# Patient Record
Sex: Female | Born: 1970 | Hispanic: Yes | Marital: Married | Smoking: Never smoker
Health system: Southern US, Community
[De-identification: ages and names within clinical notes are randomized; demographics above are authoritative.]

## PROBLEM LIST (undated history)

## (undated) DIAGNOSIS — I10 Essential (primary) hypertension: Secondary | ICD-10-CM

## (undated) DIAGNOSIS — N289 Disorder of kidney and ureter, unspecified: Secondary | ICD-10-CM

## (undated) HISTORY — PX: LUNG SURGERY: SHX703

---

## 2015-01-16 ENCOUNTER — Emergency Department (HOSPITAL_COMMUNITY)
Admission: EM | Admit: 2015-01-16 | Discharge: 2015-01-16 | Disposition: A | Discharge status: Home / Self Care | Payer: Self-pay | Attending: Emergency Medicine | Admitting: Emergency Medicine

## 2015-01-16 ENCOUNTER — Emergency Department (HOSPITAL_COMMUNITY): Payer: Self-pay

## 2015-01-16 ENCOUNTER — Encounter (HOSPITAL_COMMUNITY): Payer: Self-pay | Admitting: *Deleted

## 2015-01-16 DIAGNOSIS — Z87442 Personal history of urinary calculi: Secondary | ICD-10-CM | POA: Insufficient documentation

## 2015-01-16 DIAGNOSIS — I10 Essential (primary) hypertension: Secondary | ICD-10-CM | POA: Insufficient documentation

## 2015-01-16 DIAGNOSIS — Z3202 Encounter for pregnancy test, result negative: Secondary | ICD-10-CM | POA: Insufficient documentation

## 2015-01-16 DIAGNOSIS — Z9889 Other specified postprocedural states: Secondary | ICD-10-CM | POA: Insufficient documentation

## 2015-01-16 DIAGNOSIS — N201 Calculus of ureter: Secondary | ICD-10-CM

## 2015-01-16 HISTORY — DX: Essential (primary) hypertension: I10

## 2015-01-16 HISTORY — DX: Disorder of kidney and ureter, unspecified: N28.9

## 2015-01-16 LAB — BASIC METABOLIC PANEL
Anion gap: 7 (ref 5–15)
BUN: 9 mg/dL (ref 6–23)
CO2: 26 mmol/L (ref 19–32)
Calcium: 8.7 mg/dL (ref 8.4–10.5)
Chloride: 101 mmol/L (ref 96–112)
Creatinine, Ser: 0.76 mg/dL (ref 0.50–1.10)
GFR calc Af Amer: >90 mL/min (ref >90)
GFR calc non Af Amer: >90 mL/min (ref >90)
Glucose, Bld: 157 mg/dL — ABNORMAL HIGH (ref 70–99)
POTASSIUM: 3.4 mmol/L — AB (ref 3.5–5.1)
Sodium: 134 mmol/L — ABNORMAL LOW (ref 135–145)

## 2015-01-16 LAB — URINE MICROSCOPIC-ADD ON

## 2015-01-16 LAB — URINALYSIS, ROUTINE W REFLEX MICROSCOPIC
Bilirubin Urine: NEGATIVE
GLUCOSE, UA: NEGATIVE mg/dL
Ketones, ur: NEGATIVE mg/dL
Leukocytes, UA: NEGATIVE
Nitrite: NEGATIVE
PH: 8 (ref 5.0–8.0)
PROTEIN: NEGATIVE mg/dL
Specific Gravity, Urine: 1.014 (ref 1.005–1.030)
UROBILINOGEN UA: 0.2 mg/dL (ref 0.0–1.0)

## 2015-01-16 LAB — PREGNANCY, URINE: Preg Test, Ur: NEGATIVE

## 2015-01-16 MED ORDER — ONDANSETRON HCL 4 MG/2ML IJ SOLN
4.0000 mg | Freq: Once | INTRAMUSCULAR | Status: AC
  Administered 2015-01-16: 4 mg via INTRAVENOUS
  Filled 2015-01-16: qty 2

## 2015-01-16 MED ORDER — HYDROMORPHONE HCL 1 MG/ML IJ SOLN
1.0000 mg | Freq: Once | INTRAMUSCULAR | Status: AC
  Administered 2015-01-16: 1 mg via INTRAVENOUS
  Filled 2015-01-16: qty 1

## 2015-01-16 MED ORDER — OXYCODONE-ACETAMINOPHEN 5-325 MG PO TABS: 1.0000 | ORAL_TABLET | Freq: Four times a day (QID) | ORAL | Status: DC | PRN

## 2015-01-16 MED ORDER — ONDANSETRON 4 MG PO TBDP: 4.0000 mg | ORAL_TABLET | Freq: Three times a day (TID) | ORAL | Status: DC | PRN

## 2015-01-16 NOTE — ED Notes (Signed)
Per EMS pt from home with c/o left sided abdominal pain an frequent urination since 4:00 this morning.

## 2015-01-16 NOTE — Discharge Instructions (Signed)
Cálculos renales °(Kidney Stones) °Los cálculos renales (urolitiasis) son masas sólidas que se forman en el interior de los riñones. El dolor intenso es causado por el movimiento de la piedra a través del riñón, uréter, vejiga y uretra (tracto urinario). Cuando la piedra se mueve, el uréter hace un espasmo alrededor de la misma. El cálculo generalmente se elimina con el pis (la orina).  °CUIDADOS EN EL HOGAR °· Debe ingerir gran cantidad de líquido para mantener la orina de tono claro o color amarillo pálido. Esto ayudará a eliminar la piedra. °· Cuele la orina con el colador que le han provisto. Noorine de otra forma que no sea a través del colador, ni siquiera una vez. Si elimina la piedra, se retendrá en el colador. Puede ser tan pequeña como un grano de sal. Llévela a su visita con el médico. Esto ayudará a que el médico le indique qué puede hacer para tratar de prevenir la ocurrencia de nuevos cálculos renales. °· Sólo tome los medicamentos que le haya indicado su médico. °· Concurra a las consultas de control con el médico, según las indicaciones. °· Hágase las radiografías según las indicaciones de su médico. °SOLICITE AYUDA SI: °Siente un dolor que empeora aún tomando analgésicos. °SOLICITE AYUDA DE INMEDIATO SI:  °· El dolor no mejora con los medicamentos recetados. °· Siente escalofríos o fiebre. °· El dolor aumenta y empeora en las siguientes 18 horas. °· Siente un nuevo dolor en el vientre (abdominal). °· Sufre mareos o se desmaya. °· Nota que no puede orinar. °ASEGÚRESE DE QUE:  °· Comprende estas instrucciones. °· Controlará su afección. °· Recibirá ayuda de inmediato si no mejora o si empeora. °Document Released: 12/14/2008 Document Revised: 05/20/2013 °ExitCare® Patient Information ©2015 [employer]. This information is not intended to replace advice given to you by your health care provider. Make sure you discuss any questions you have with your health care provider. ° °

## 2015-01-16 NOTE — ED Provider Notes (Signed)
CSN: [id number]     Arrival date & time 01/16/15  0720 History   First MD Initiated Contact with Patient 01/16/15 0725     Chief Complaint  Patient presents with  . Abdominal Pain     (Consider location/radiation/quality/duration/timing/severity/associated sxs/prior Treatment) HPI Comments: Pt comes in with left flank pain and abdominal pain that started yesterday. Has urinary frequency. No fever, vomiting or diarrhea. Hasn't taken anything for the symptoms. Nothing makes the symptoms better or worse:pain is similar to previous kidney stones  The history is provided by the patient. A language interpreter was used.    Past Medical History  Diagnosis Date  . Renal disorder     hx of kidney stones  . Hypertension    Past Surgical History  Procedure Laterality Date  . Lung surgery    . Cesarean section     No family history on file. History  Substance Use Topics  . Smoking status: Never Smoker   . Smokeless tobacco: Not on file  . Alcohol Use: No   OB History    No data available     Review of Systems  All other systems reviewed and are negative.     Allergies  Review of patient's allergies indicates no known allergies.  Home Medications   Prior to Admission medications   Not on File   BP 187/104 mmHg  Pulse 75  Temp(Src) 98.2 F (36.8 C) (Oral)  Resp 18  Wt 160 lb (72.576 kg)  SpO2 100%  LMP 01/02/2015 Physical Exam  Constitutional: She is oriented to person, place, and time. She appears well-developed and well-nourished.  HENT:  Head: Normocephalic and atraumatic.  Cardiovascular: Normal rate and regular rhythm.   Pulmonary/Chest: Effort normal and breath sounds normal.  Abdominal: Soft. Bowel sounds are normal. There is no CVA tenderness.  Musculoskeletal: Normal range of motion.  Neurological: She is alert and oriented to person, place, and time.  Skin: Skin is warm and dry.  Psychiatric: She has a normal mood and affect.  Nursing note and vitals  reviewed.   ED Course  Procedures (including critical care time) Labs Review Labs Reviewed  BASIC METABOLIC PANEL - Abnormal; Notable for the following:    Sodium 134 (*)    Potassium 3.4 (*)    Glucose, Bld 157 (*)    All other components within normal limits  URINALYSIS, ROUTINE W REFLEX MICROSCOPIC - Abnormal; Notable for the following:    APPearance CLOUDY (*)    Hgb urine dipstick SMALL (*)    All other components within normal limits  URINE MICROSCOPIC-ADD ON - Abnormal; Notable for the following:    Bacteria, UA FEW (*)    All other components within normal limits  PREGNANCY, URINE    Imaging Review Ct Renal Stone Study  01/16/2015   CLINICAL DATA:  44 year old female with a history of abdominal pain. Frequent urination.  EXAM: CT ABDOMEN AND PELVIS WITHOUT CONTRAST  TECHNIQUE: Multidetector CT imaging of the abdomen and pelvis was performed following the standard protocol without IV contrast.  COMPARISON:  None.  FINDINGS: Lower chest:  Unremarkable appearance of the superficial soft tissues the chest wall.  Heart size within normal limits with no pericardial fluid/ thickening.  Small hiatal hernia.  No lower mediastinal adenopathy.  Abdomen/pelvis:  Unremarkable appearance of the liver, spleen, bilateral adrenal glands.  Unremarkable appearance of pancreas. Unremarkable appearance of gallbladder. No intrahepatic or extrahepatic biliary ductal dilatation.  Bilateral nephrolithiasis.  On the right there is a  3 mm stone in the dependent collecting system with no hydronephrosis or perinephric fluid. Unremarkable appearance of the right ureter.  1 cm stone in the hilum/ talus L system of the left collecting system. There is pelvicaliectasis/mild hydronephrosis with perinephric fluid/stranding. Dilation of the length of the left ureter with a distal ureteral stone measuring 7 mm at the ureterovesical junction.  Urinary bladder unremarkable with no intraluminal stones.  No abnormally  distended small bowel or colon. No transition point identified. Normal appendix.  No free intraperitoneal air  Unremarkable appearance of the uterus and adnexa.  Vascular:  No significant atherosclerosis.  No abdominal aneurysm.  Musculoskeletal:  No displaced fracture. Mild scoliotic curvature of the thoracolumbar spine.  No significant degenerative changes with no bony canal narrowing.  IMPRESSION: Obstructive left-sided ureteral stone at the ureterovesical junction measuring 7 mm. There is associated mild hydronephrosis/ pelvicaliectasis of the left with inflammatory stranding/ fluid surrounding the left kidney. If there is concern for infection, recommend correlation with urinalysis.  Bilateral nonobstructive kidney stones, with 3 mm right-sided stone and 1 cm left-sided stone.  Signed,  Yvone NeuJaime S. Loreta AveWagner, DO  [facility]  [facility]   Electronically Signed   By: Gilmer MorJaime  Wagner D.O.   On: 01/16/2015 09:19     EKG Interpretation None      MDM   Final diagnoses:  Ureteral stone    Pt is not having fever and is more comfortable at this time. Will give one more dose of dilaudid here and have her follow up with urology. Discussed return precaution. Will send home with oxycodone and zofran. No infection in the urine at this time    Teressa LowerVrinda Shaw Dobek, NP 01/16/15 1018  Azalia BilisKevin Campos, MD 01/16/15 1036

## 2015-01-16 NOTE — ED Notes (Signed)
Pt ambulated to bathroom 

## 2015-01-16 NOTE — ED Notes (Signed)
Bed: WA06 Expected date:  Expected time:  Means of arrival:  Comments: EMS 44yo F abd pain left sided with rebound tenderness, UTI sx

## 2015-03-17 ENCOUNTER — Ambulatory Visit: Payer: Self-pay | Attending: Internal Medicine

## 2015-08-15 ENCOUNTER — Encounter (HOSPITAL_COMMUNITY): Payer: Self-pay | Admitting: Emergency Medicine

## 2015-08-15 ENCOUNTER — Emergency Department (INDEPENDENT_AMBULATORY_CARE_PROVIDER_SITE_OTHER)
Admission: EM | Admit: 2015-08-15 | Discharge: 2015-08-15 | Disposition: A | Discharge status: Home / Self Care | Payer: Self-pay | Source: Home / Self Care | Attending: Family Medicine | Admitting: Family Medicine

## 2015-08-15 DIAGNOSIS — T148 Other injury of unspecified body region: Secondary | ICD-10-CM

## 2015-08-15 DIAGNOSIS — M546 Pain in thoracic spine: Secondary | ICD-10-CM

## 2015-08-15 DIAGNOSIS — T148XXA Other injury of unspecified body region, initial encounter: Secondary | ICD-10-CM

## 2015-08-15 LAB — POCT URINALYSIS DIP (DEVICE)
BILIRUBIN URINE: NEGATIVE
GLUCOSE, UA: NEGATIVE mg/dL
KETONES UR: NEGATIVE mg/dL
LEUKOCYTES UA: NEGATIVE
Nitrite: NEGATIVE
Protein, ur: NEGATIVE mg/dL
Specific Gravity, Urine: 1.02 (ref 1.005–1.030)
UROBILINOGEN UA: 0.2 mg/dL (ref 0.0–1.0)
pH: 7 (ref 5.0–8.0)

## 2015-08-15 LAB — POCT PREGNANCY, URINE: Preg Test, Ur: NEGATIVE

## 2015-08-15 MED ORDER — TRAMADOL HCL 50 MG PO TABS
50.0000 mg | ORAL_TABLET | Freq: Four times a day (QID) | ORAL | Status: DC | PRN
Start: 2015-08-15 — End: 2015-12-07

## 2015-08-15 MED ORDER — NAPROXEN 375 MG PO TBEC: DELAYED_RELEASE_TABLET | ORAL | Status: DC

## 2015-08-15 NOTE — ED Provider Notes (Signed)
CSN: [id number]     Arrival date & time 08/15/15  1320 History   First MD Initiated Contact with Patient 08/15/15 1505     Chief Complaint  Patient presents with  . Flank Pain   (Consider location/radiation/quality/duration/timing/severity/associated sxs/prior Treatment) HPI Comments: 44 year old female with a complaint of pain to the right parathoracic musculature for at least 4 months. He is gradually getting worse over that time. She works as a Producer, television/film/video at [employer]. She states that the work exacerbates her pain and she is unable to do her job as quickly as usual and she is always the last one to finish due to her back pain. She states the area feels hot. It is worse with turning and other such movements such as lifting and pulling. Denies shortness of breath or chest pain. Denies focal paresthesias or weakness. She states she had a fall 4 months ago but is unsure as to whether this was the cause of the pain.   Past Medical History  Diagnosis Date  . Renal disorder     hx of kidney stones  . Hypertension    Past Surgical History  Procedure Laterality Date  . Lung surgery    . Cesarean section     No family history on file. Social History  Substance Use Topics  . Smoking status: Never Smoker   . Smokeless tobacco: None  . Alcohol Use: No   OB History    No data available     Review of Systems  Constitutional: Positive for activity change. Negative for fever and chills.  HENT: Negative.   Respiratory: Negative.   Cardiovascular: Negative.   Genitourinary: Negative.   Musculoskeletal: Positive for myalgias and back pain. Negative for neck pain and neck stiffness.       As per HPI  Skin: Negative for color change and rash.  Neurological: Negative.  Negative for tremors, speech difficulty, numbness and headaches.    Allergies  Review of patient's allergies indicates no known allergies.  Home Medications   Prior to Admission medications   Medication Sig  Start Date End Date Taking? Authorizing Provider  bisoprolol-hydrochlorothiazide (ZIAC) 5-6.25 MG per tablet Take 1 tablet by mouth daily.    Historical Provider, MD  Naproxen 375 MG TBEC Take 1 po q 12h prn pain. Take with food 08/15/15   Hayden Rasmussen, NP  ondansetron (ZOFRAN ODT) 4 MG disintegrating tablet Take 1 tablet (4 mg total) by mouth every 8 (eight) hours as needed for nausea or vomiting. 01/16/15   Teressa Lower, NP  oxyCODONE-acetaminophen (PERCOCET/ROXICET) 5-325 MG per tablet Take 1-2 tablets by mouth every 6 (six) hours as needed for severe pain. 01/16/15   Teressa Lower, NP  traMADol (ULTRAM) 50 MG tablet Take 1 tablet (50 mg total) by mouth every 6 (six) hours as needed for moderate pain. 08/15/15   Hayden Rasmussen, NP   Meds Ordered and Administered this Visit  Medications - No data to display  BP 153/94 mmHg  Pulse 78  Temp(Src) 98.9 F (37.2 C) (Oral)  Resp 16  SpO2 100%  LMP 07/24/2015 No data found.   Physical Exam  Constitutional: She is oriented to person, place, and time. She appears well-developed and well-nourished. No distress.  HENT:  Head: Normocephalic and atraumatic.  Eyes: EOM are normal.  Neck: Normal range of motion. Neck supple.  Cardiovascular: Normal rate.   Pulmonary/Chest: Effort normal and breath sounds normal. No respiratory distress.  Musculoskeletal: Normal range of motion. She exhibits  no edema.   There is tenderness to palpation along the right parathoracic spine musculature. Pain is exacerbated by having the patient leaning forward and rotation to the right and left. There is no spinal tenderness. No spinal deformity, swelling or discoloration.  Distal muscle strength is intact. Ambulation is coordinated and balanced. There is no chest wall  tenderness, no   tendernessto the right back or the right ribs. no tenderness left of the thoracic spine.  Neurological: She is alert and oriented to person, place, and time. No cranial nerve deficit.   Skin: Skin is warm and dry.  Nursing note and vitals reviewed.   ED Course  Procedures (including critical care time)  Labs Review Labs Reviewed  POCT URINALYSIS DIP (DEVICE) - Abnormal; Notable for the following:    Hgb urine dipstick TRACE (*)    All other components within normal limits  POCT PREGNANCY, URINE    Imaging Review No results found.   Visual Acuity Review  Right Eye Distance:   Left Eye Distance:   Bilateral Distance:    Right Eye Near:   Left Eye Near:    Bilateral Near:         MDM   1. Right-sided thoracic back pain   2. Muscle strain    The patient has been instructed that her job is exacerbating her pain. She may have to have physical therapy in the future but will have to have a referral and evaluation by a PCP. The patient has been given financial aid assistance card as well as [facility] She has an appointment next month. She is to apply heat to the area pain. She is given a prescription for Naprosyn 375 twice a day all tram 50 mg every 6 hours hours when necessary pain #15. A note was also written stating that she was here today and that she is requesting a light duty. Any additional notes for restrictions or off work on need to come from her PCP. An interpreter was used for this encounter.    Hayden Rasmussenavid Barbar Brede, NP 08/15/15 1620

## 2015-08-15 NOTE — Discharge Instructions (Signed)
Terapia con calor (Heat Therapy) La terapia con calor puede ayudar a aliviar articulaciones y msculos doloridos, lesionados, tensos y rgidos. El calor [employer], lo cual puede ayudar a Engineer, materials.  RIESGOS Y COMPLICACIONES Si tiene cualquiera de los [address], no utilice la terapia con calor a menos que su mdico lo haya autorizado:  Mala circulacin.  Heridas que se estn curando o piel con cicatrices en la zona a tratar.  Diabetes, enfermedades cardacas o hipertensin arterial.  Incapacidad de sentir (entumecimiento) la zona tratada.  Hinchazn inusual de la zona a tratar.  Infecciones activas.  Cogulos sanguneos.  Cncer.  Incapacidad de Marketing executive. Esto puede incluir nios pequeos y personas que tienen problemas con la funcin cerebral (demencia).  Embarazo. La terapia con calor solo se debe usar en lesiones viejas, preexistentes o de larga duracin (crnicas). No utilice la terapia con calor en lesiones nuevas a menos que el mdico se lo indique. CMO USAR LA TERAPIA CON CALOR Existen varios tipos distintos de terapia con calor, como:  Compresas hmedas calientes.  Bao de agua caliente.  Bolsa de agua caliente.  Almohadilla trmica.  Bolsa de gel caliente.  Vendaje caliente.  Almohadilla trmica. Utilice el mtodo de terapia con calor que le sugiera su mdico. Siga las indicaciones del mdico sobre cmo y cundo usar la terapia con Airline pilot. RECOMENDACIONES GENERALES PARA LA TERAPIA CON CALOR  No duerma mientras [place] la terapia con calor. Utilice la terapia con calor solo mientras est despierto.  La piel puede volverse rosada mientras [place] la terapia con calor. No use la terapia con calor si la piel se pone roja.  No use la terapia con calor si siente un dolor nuevo.  Una temperatura muy alta o una exposicin prolongada al calor puede causar quemaduras. Sea cauto con la terapia de calor para evitar quemar la piel.  No use la  terapia con calor en zonas de la piel que ya estn irritadas, como con una erupcin o una quemadura de sol. SOLICITE ATENCIN MDICA SI:  Observa ampollas, enrojecimiento, hinchazn o adormecimiento.  Siente un dolor nuevo.  El dolor [place]. ASEGRESE DE QUE:  Comprende estas instrucciones.  Controlar su afeccin.  Recibir ayuda de inmediato si no mejora o si empeora.   Esta informacin no tiene Theme park manager el consejo del mdico. Asegrese de hacerle al mdico cualquier pregunta que tenga.   Document Released: 12/10/2011 Document Revised: 10/08/2014 Elsevier Interactive Patient Education 2016 [employer].  Distensin torcica Producer, television/film/video) Una distensin torcica, a veces llamada distensin dorsal, es una lesin [employer] o los tendones que estn unidos a la parte superior de la espalda, por detrs del pecho. Este tipo de lesin se produce debido a la sobreexigencia o la sobrecarga de un msculo.  Las distensiones torcicas pueden ser leves o graves. Las leves se caracterizan por la distensin de un msculo o un tendn sin que se produzca su rotura. Estas lesiones pueden curarse en el trmino de 1 o 2semanas. Las distensiones ms graves se caracterizan por la rotura de las fibras musculares o de los tendones. Estas causarn ms dolor y pueden tardar de 6 a 8semanas en curarse. CAUSAS Esta afeccin puede ser causada por lo siguiente:  Una lesin en la cual se ejerce una fuerza repentina en un msculo.  La actividad fsica que se realiza sin Dance movement psychotherapist correspondiente.  El uso excesivo del msculo.  La manera incorrecta de realizar algunos movimientos.  Otras lesiones alrededor de la  zona dorsal o que producen tensin en el rea, lo que causa una D.R. Horton, Incsobrecarga en los msculos. En algunos casos, es posible que la causa no se conozca. FACTORES DE RIESGO Esta lesin es ms frecuente en las siguientes personas:  Los deportistas.  Las Eli Lilly and Companypersonas que  tienen obesidad. SNTOMAS El sntoma principal de esta afeccin es el dolor, especialmente con el movimiento. Otros sntomas pueden ser los siguientes:  Hematomas.  Hinchazn.  Espasmo. DIAGNSTICO Esta afeccin se puede diagnosticar mediante un examen fsico. Se pueden tomar radiografas para descartar una fractura. TRATAMIENTO El tratamiento de esta afeccin puede incluir lo siguiente:  Reposo y aplicacin de hielo en la zona de la lesin.  Fisioterapia. que incluir ejercicios de elongacin y fortalecimiento.  Analgsicos y antiinflamatorios. INSTRUCCIONES PARA EL CUIDADO EN EL HOGAR  Descanse todo lo que sea necesario. Siga las indicaciones del mdico respecto de las restricciones en las actividades.  Si se lo indican, aplique hielo sobre la zona lesionada:  Ponga el hielo en una bolsa plstica.  Coloque una toalla entre la piel y la bolsa de hielo.  Coloque el hielo durante 20minutos, 2 a 3veces por Futures traderda.  Tome los medicamentos de venta libre y los recetados solamente como se lo haya indicado el mdico.  Comience a Copyhacer los ejercicios como se lo hayan indicado el mdico o el fisioterapeuta.  Antes de hacer actividad fsica o de practicar deportes, siempre precaliente correctamente.  Flexione las rodillas antes de levantar objetos pesados.  Concurra a todas las visitas de control como se lo haya indicado el mdico. Esto es importante. SOLICITE ATENCIN MDICA SI:  El dolor no se alivia con los United Parcelmedicamentos.  El dolor, los hematomas o la hinchazn estn empeorando.  Tiene fiebre. SOLICITE ATENCIN MDICA DE INMEDIATO SI:  Le falta el aire.  Siente dolor en el pecho.  Siente adormecimiento o debilidad en las piernas.  Tiene prdidas involuntarias de orina (incontinencia urinaria).   Esta informacin no tiene Theme park managercomo fin reemplazar el consejo del mdico. Asegrese de hacerle al mdico cualquier pregunta que tenga.   Document Released: 12/25/2007 Document  Revised: 06/08/2015 Elsevier Interactive Patient Education [employer]2016 Elsevier Inc.

## 2015-08-15 NOTE — ED Notes (Signed)
Patient assessment obtained with assistance of language line.  Patient has right lower rib cage pain, pain that occurred after a fall 4 months ago.  Patient reports falling in front of her house and landed on ground.  Reports this lower rib pain has been getting worse since fall.  Patient works in Multimedia programmerhouse keeping.  Patient mentions concern for damaging lung.

## 2015-08-23 ENCOUNTER — Inpatient Hospital Stay: Payer: Self-pay | Admitting: Family Medicine

## 2015-12-07 ENCOUNTER — Encounter: Payer: Self-pay | Admitting: Internal Medicine

## 2015-12-07 ENCOUNTER — Ambulatory Visit: Payer: BLUE CROSS/BLUE SHIELD | Attending: Internal Medicine | Admitting: Internal Medicine

## 2015-12-07 ENCOUNTER — Telehealth: Payer: Self-pay

## 2015-12-07 VITALS — BP 144/92 | HR 63 | Temp 98.2°F | Resp 16 | Ht <= 58 in | Wt 160.0 lb

## 2015-12-07 DIAGNOSIS — I160 Hypertensive urgency: Secondary | ICD-10-CM | POA: Insufficient documentation

## 2015-12-07 DIAGNOSIS — M545 Low back pain: Secondary | ICD-10-CM | POA: Insufficient documentation

## 2015-12-07 DIAGNOSIS — I10 Essential (primary) hypertension: Secondary | ICD-10-CM | POA: Insufficient documentation

## 2015-12-07 DIAGNOSIS — N200 Calculus of kidney: Secondary | ICD-10-CM | POA: Insufficient documentation

## 2015-12-07 DIAGNOSIS — Z0000 Encounter for general adult medical examination without abnormal findings: Secondary | ICD-10-CM | POA: Insufficient documentation

## 2015-12-07 LAB — RENAL FUNCTION PANEL
Albumin: 3.8 g/dL (ref 3.6–5.1)
BUN: 8 mg/dL (ref 7–25)
CHLORIDE: 101 mmol/L (ref 98–110)
CO2: 30 mmol/L (ref 20–31)
CREATININE: 0.67 mg/dL (ref 0.50–1.10)
Calcium: 9 mg/dL (ref 8.6–10.2)
GLUCOSE: 91 mg/dL (ref 65–99)
Phosphorus: 2.8 mg/dL (ref 2.5–4.5)
Potassium: 3.5 mmol/L (ref 3.5–5.3)
Sodium: 135 mmol/L (ref 135–146)

## 2015-12-07 LAB — POCT URINE PREGNANCY: PREG TEST UR: NEGATIVE

## 2015-12-07 LAB — POCT GLYCOSYLATED HEMOGLOBIN (HGB A1C): HEMOGLOBIN A1C: 5.3

## 2015-12-07 MED ORDER — BISOPROLOL-HYDROCHLOROTHIAZIDE 5-6.25 MG PO TABS: 1.0000 | ORAL_TABLET | Freq: Every day | ORAL | Status: DC

## 2015-12-07 MED ORDER — TRAMADOL HCL 50 MG PO TABS: 50.0000 mg | ORAL_TABLET | Freq: Three times a day (TID) | ORAL | Status: DC | PRN

## 2015-12-07 MED ORDER — CYCLOBENZAPRINE HCL 5 MG PO TABS: 5.0000 mg | ORAL_TABLET | Freq: Three times a day (TID) | ORAL | Status: DC | PRN

## 2015-12-07 MED ORDER — CLONIDINE HCL 0.2 MG PO TABS
0.2000 mg | ORAL_TABLET | Freq: Once | ORAL | Status: AC
  Administered 2015-12-07: 0.2 mg via ORAL

## 2015-12-07 MED FILL — traMADol HCL 50 MG TABS: 50 | 10 days supply | Qty: 30 | Fill #0

## 2015-12-07 NOTE — Progress Notes (Addendum)
Debbie Stone, is a 45 y.o. female  ZOX:[id number]  WJX:[id number]  DOB - 1970-12-20  CC:  Chief Complaint  Patient presents with  . Back Pain  . Establish Care       HPI: Debbie Stone is a 45 y.o. female here today to establish medical care.  Hasn't had primary care for years.  She is here w/ her husband.  Spanish interpreter on phone.   Larey Seat on her back about 1 year ago and since has co of intermittant right lower back pain.   Also, dx w/ kidney stones 2010 (CT renal scan 12/2014 showed 7mm stone left side w/ bilateral small stones.  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  Says pain extends down to bilateral leg/feet, and makes it hard for her to sleep. Denies tingling and numbness.  Says she takes tylenol and advil sometimes, but doesn't help.  Did not get fu when she fell 1 year ago due to insurance issues.  Only takes her bp meds when she has a headache.  She thinks the pain is making her bp worse.  Normally, husband checks bp at home and normal.   Patient has No headache, No chest pain, No abdominal pain - No Nausea, No new weakness tingling or numbness, No Cough - SOB.  States she feels warm sometimes, but denies fever/dysuria.  No Known Allergies Past Medical History  Diagnosis Date  . Renal disorder     hx of kidney stones  . Hypertension    Current Outpatient Prescriptions on File Prior to Visit  Medication Sig Dispense Refill  . Naproxen 375 MG TBEC Take 1 po q 12h prn pain. Take with food (Patient not taking: Reported on 12/07/2015) 30 each 0  . ondansetron (ZOFRAN ODT) 4 MG disintegrating tablet Take 1 tablet (4 mg total) by mouth every 8 (eight) hours as needed for nausea or vomiting. (Patient not taking: Reported on 12/07/2015) 20 tablet 0  . oxyCODONE-acetaminophen (PERCOCET/ROXICET) 5-325 MG per tablet Take 1-2 tablets by mouth every 6 (six) hours as needed for severe pain. (Patient not  taking: Reported on 12/07/2015) 15 tablet 0   No current facility-administered medications on file prior to visit.   History reviewed. No pertinent family history. Social History   Social History  . Marital Status: Married    Spouse Name: N/A  . Number of Children: N/A  . Years of Education: N/A   Occupational History  . Not on file.   Social History Main Topics  . Smoking status: Never Smoker   . Smokeless tobacco: Not on file  . Alcohol Use: No  . Drug Use: No  . Sexual Activity: Yes   Other Topics Concern  . Not on file   Social History Narrative    Review of Systems: Constitutional: Negative for fever, chills, diaphoresis, activity change, appetite change and fatigue. HENT: Negative for ear pain, nosebleeds, congestion, facial swelling, rhinorrhea, neck pain, neck stiffness and ear discharge.  Eyes: Negative for pain, discharge, redness, itching and visual disturbance. Respiratory: Negative for cough, choking, chest tightness, shortness of breath, wheezing and stridor.  Cardiovascular: + palpitations about 1 month ago and resolved. Gastrointestinal: Negative for abdominal distention. Genitourinary: Negative for dysuria, urgency, frequency, hematuria, flank pain, decreased urine volume, difficulty urinating and dyspareunia.  Musculoskeletal: + back pain, r>L, radiating down both legs, no numbness/tingling of legs, denies urinary/stool incontinence; NO joint swelling; +arthralgia and gait problem noted b/c of  pain. Neurological: Negative for dizziness, tremors, seizures, syncope, facial asymmetry, speech difficulty, weakness, light-headedness, numbness and headaches.  Hematological: Negative for adenopathy. Does not bruise/bleed easily. Psychiatric/Behavioral: Negative for hallucinations, behavioral problems, confusion, dysphoric mood, decreased concentration and agitation.    Objective:   Filed Vitals:   12/07/15 0959  BP: 171/116  Pulse: 63  Temp: 98.2 F (36.8 C)    Resp: 16   Repeat; sbp 140s  Physical Exam: Constitutional: Patient appears well-developed and well-nourished. No distress.  aaox 3. Husband in room. Spanish interpreter via phn.   Pleasant.  HENT: Normocephalic, atraumatic, External right and left ear normal. Oropharynx is clear and moist.  Eyes: Conjunctivae and EOM are normal. PERRL, no scleral icterus. Neck: Normal ROM. Neck supple. No JVD. No tracheal deviation. No thyromegaly. CVS: RRR, S1/S2 +, no murmurs, no gallops, no carotid bruit.   No le edema Pulmonary: Effort and breath sounds normal, no stridor, rhonchi, wheezes, rales.  Abdominal: Soft. BS +, no distension, tenderness, rebound or guarding.  No cva tenderness bilaterally.  Musculoskeletal: Normal range of motion. No edema.  Left anterior hip pain on flexion of left knee.  Bilateral back pain reproducible on flexion of knees.   Lymphadenopathy: No lymphadenopathy cervical. Neuro: Alert. Normal reflexes 2+ bilat le, muscle tone coordination. Skin: Skin is warm and dry. No rash noted. Not diaphoretic. No erythema. No pallor. Psychiatric: Normal mood and affect. Behavior, judgment, thought content normal.  No results found for: WBC, HGB, HCT, MCV, PLT Lab Results  Component Value Date   CREATININE 0.76 01/16/2015   BUN 9 01/16/2015   NA 134* 01/16/2015   K 3.4* 01/16/2015   CL 101 01/16/2015   CO2 26 01/16/2015    Lab Results  Component Value Date   HGBA1C 5.3 12/07/2015   Lipid Panel  No results found for: CHOL, TRIG, HDL, CHOLHDL, VLDL, LDLCALC     Assessment and plan:   1. Healthcare maintenance - POCT A1C  5.3 - due for pap, mammogram 2015 nml per pt.  2. Essential hypertension - takes home bp prn when has headache - cloNIDine (CATAPRES) tablet 0.2 mg; Take 1 tablet (0.2 mg total) by mouth once.  - given in room today  3. Hypertensive urgency - noted on arrival, may be due to pain.    Pt states when not in pain, bp chk at home normal. - clonidine  given, recd to take home rx daily for now until pain better.  Than can reeval need. - bisoprolol-hydrochlorothiazide (ZIAC) 5-6.25 MG tablet; Take 1 tablet by mouth daily.  Dispense: 90 tablet; Refill: 3 - repeat sbp 140s on rechk.  4. Bilateral low back pain, with sciatica presence unspecified - will r/ herniated disc/renal stone as cause today. - CT Lumbar Spine W Contrast; Future - cyclobenzaprine (FLEXERIL) 5 MG tablet; Take 1 tablet (5 mg total) by mouth 3 (three) times daily as needed for muscle spasms.  Dispense: 30 tablet; Refill: 0 - traMADol (ULTRAM) 50 MG tablet; Take 1 tablet (50 mg total) by mouth every 8 (eight) hours as needed.  Dispense: 30 tablet; Refill: 0  5. Kidney stones, ho - UA/M w/rflx Culture, Routine - CT RENAL STONE STUDY today - Renal Function Panel today - POCT urine pregnancy   Return in about 4 weeks (around 01/04/2016).    The patient was given clear instructions to go to ER or return to medical center if symptoms don't improve, worsen or new problems develop. The patient verbalized understanding. The patient was  told to call to get lab results if they haven't heard anything in the next week.       Pete Glatterawn T Lamerle Jabs, MD, MBA/MHA [facility] [place], [place] [phone]   12/07/2015, 10:44 AM    12/13/15 at 913am; ADDENDUM/  Insurance denied the CT lumbar spine, but did approve the CT renal protocol.   Dc'd CT lumbar order, and ordered lumbar xrays.  Pt to be notified.

## 2015-12-07 NOTE — Addendum Note (Signed)
Addended byDierdre Searles: LANGELAND, DAWN T on: 12/07/2015 12:50 PM   Modules accepted: Orders

## 2015-12-07 NOTE — Addendum Note (Signed)
Addended by: Margaretmary LombardLISBON, NUBIA K on: 12/07/2015 12:57 PM   Modules accepted: Orders

## 2015-12-07 NOTE — Patient Instructions (Signed)
-   Pick up meds at pharmacy - labs here [facility]   Dolor de espalda en adultos (Back Pain, Adult) El dolor de espalda es muy frecuente. A menudo mejora con el tiempo. La causa del dolor de espalda generalmente no es peligrosa. La Harley-Davidsonmayora de las personas puede aprender a Runner, broadcasting/film/videomanejar el dolor de espalda por s mismas.  CUIDADOS EN EL HOGAR  Controle su dolor de espalda a fin de Public house managerdetectar algn cambio. Las siguientes indicaciones ayudarn a Psychologist, clinicalaliviar cualquier dolor que pueda sentir:  Materials engineerMantngase activo. Comience con caminatas cortas sobre superficies planas si es posible. Trate de caminar un poco ms cada da.  Haga ejercicios con regularidad tal como le indic el mdico. El ejercicio ayuda a que su espalda se cure ms rpidamente. Tambin ayuda a prevenir futuras lesiones al Kimberly-Clarkmantener los msculos fuertes y flexibles.  No se siente, conduzca ni permanezca de pie durante ms de 30 minutos.  No permanezca en la cama. Si hace reposo ms de 1 a 2 das, puede demorar su recuperacin.  Sea cuidadoso al inclinarse o levantar un objeto. Use una tcnica apropiada para levantar peso:  Flexione las rodillas.  Mantenga el objeto cerca del cuerpo.  No gire.  Duerma sobre un NVR Inccolchn firme. Recustese sobre un costado y flexione las rodillas. Si se recuesta Fisher Scientificsobre la espalda, coloque una almohada debajo de las rodillas.  Tome los medicamentos solamente como se lo haya indicado el mdico.  Aplique hielo sobre la zona lesionada.  Ponga el hielo en una bolsa plstica.  Coloque una toalla entre la piel y la bolsa de hielo.  Deje el hielo durante 20minutos, 2 a 3veces por da, durante los primeros 2 o 3das. Despus de eso, puede alternar entre compresas de hielo y Airline pilotcalor.  Evite sentir ansiedad o estrs. Encuentre maneras efectivas de lidiar con el estrs, Surveyor, miningcomo hacer ejercicio.  Mantenga un peso saludable. El peso excesivo ejerce tensin sobre la espalda. SOLICITE AYUDA SI:   Siente  dolor que no se alivia con reposo o medicamentos.  Siente cada vez ms dolor que se extiende a las piernas o los glteos.  El dolor no mejora en una semana.  Siente dolor por la noche.  Pierde peso.  Siente escalofros o fiebre. SOLICITE AYUDA DE INMEDIATO SI:   No puede controlar su materia fecal (heces) o el pis (orina).  Siente debilidad en las piernas o los brazos.  Siente prdida de la sensibilidad (adormecimiento) en las piernas o los brazos.  Tiene malestar estomacal (nuseas) o vomita.  Siente dolor de estmago (abdominal).  Siente que se desvanece (se desmaya).   Esta informacin no tiene Theme park managercomo fin reemplazar el consejo del mdico. Asegrese de hacerle al mdico cualquier pregunta que tenga.   Document Released: 04/02/2011 Document Revised: 10/08/2014 Elsevier Interactive Patient Education [employer]2016 Elsevier Inc.

## 2015-12-07 NOTE — Telephone Encounter (Signed)
CMA called [employer] interpreter spoke with Britta MccreedyBarbara [phone]#[id number]. Interpreter verified patient name and DOB. Patient was given instruction for CT scan appt [facility] on 12/13/2013. Patient was told to arrive at 1:45 pm. Patient appt is scheduled at 2pm. [facility] was [facility] and to stop by the registration desk, and they would direct her to the Radiology Dept. Patient verbalized she understood, with no further questions.

## 2015-12-07 NOTE — Progress Notes (Signed)
Patient's here for lower back pain, described as throbbing. Rates pain at 9/10.  Patient c/o of R side pain located in the rib area, described has severe, rated 10/10. She thinks it maybe due to Kidney problems she's had in the past.  Patient BP was 171/116. She states she doesn't take her BP meds on regular. She reports not feeling dizzy, but exhausted.  Patient declines flu shot. Patient agreed to diabetes screening.  Patient's here to est care with PCP.

## 2015-12-09 ENCOUNTER — Telehealth: Payer: Self-pay

## 2015-12-09 LAB — MICROSCOPIC EXAMINATION: Casts: NONE SEEN /lpf

## 2015-12-09 LAB — UA/M W/RFLX CULTURE, ROUTINE
Bilirubin, UA: NEGATIVE
GLUCOSE, UA: NEGATIVE
KETONES UA: NEGATIVE
Leukocytes, UA: NEGATIVE
Nitrite, UA: NEGATIVE
PROTEIN UA: NEGATIVE
RBC, UA: NEGATIVE
SPEC GRAV UA: 1.013 (ref 1.005–1.030)
UUROB: 0.2 mg/dL (ref 0.2–1.0)
pH, UA: 6.5 (ref 5.0–7.5)

## 2015-12-09 NOTE — Telephone Encounter (Signed)
CMA called [employer] and spoke to Robertsarmen #[id number]. Interpreter verified name and DOB. Patient was given lab results and verbalized she understood. Patient is aware of her appt scheduled for the 15th of March for the double study.

## 2015-12-09 NOTE — Telephone Encounter (Signed)
-----   Message from [name], MD sent at 12/08/2015 11:56 AM EST ----- Please call patient and tell her her kidney function labs normal. Ask her when CTs are scheduled. thx

## 2015-12-13 NOTE — Addendum Note (Signed)
Addended byDierdre Searles: Carlisia Geno T on: 12/13/2015 09:14 AM   Modules accepted: Orders

## 2015-12-14 ENCOUNTER — Ambulatory Visit (HOSPITAL_COMMUNITY): Payer: BLUE CROSS/BLUE SHIELD

## 2015-12-14 ENCOUNTER — Ambulatory Visit (HOSPITAL_COMMUNITY)
Admission: RE | Admit: 2015-12-14 | Discharge: 2015-12-14 | Disposition: A | Discharge status: Home / Self Care | Payer: BLUE CROSS/BLUE SHIELD | Source: Ambulatory Visit | Attending: Internal Medicine | Admitting: Internal Medicine

## 2015-12-14 DIAGNOSIS — R935 Abnormal findings on diagnostic imaging of other abdominal regions, including retroperitoneum: Secondary | ICD-10-CM | POA: Diagnosis not present

## 2015-12-14 DIAGNOSIS — N200 Calculus of kidney: Secondary | ICD-10-CM | POA: Insufficient documentation

## 2015-12-15 ENCOUNTER — Telehealth: Payer: Self-pay

## 2015-12-15 NOTE — Telephone Encounter (Signed)
-----   Message from [name], MD sent at 12/15/2015 11:27 AM EDT ----- Please call patient and tell her that her CT abdomen showed small left kidney stone, but it is not obstructing anything and does not need any intervention at this time.  There was a small cyst about 3.6cm noted in her urterus.   We will need to do an Ultrasound of it in 6-12 wks to make sure it goes away.  Will follow up with her back films.  Consider physical therapy if all studies negative and still having back pain.

## 2015-12-15 NOTE — Telephone Encounter (Signed)
CMA called [employer] interpreter and spoke to Norva Pavlovdgar #[id number]. Patient didn't answer either of her numbers on file. [phone], and [phone]. Interpreter left a message for the patient on both lines to return my call asap.

## 2015-12-16 NOTE — Telephone Encounter (Signed)
CMA called [employer] interpreter and spoke with [place] #[id number]. Patient was not available to take my call. Patient husband answered the phone, so a message was left stating I would give her a call back. Patient husband verbalized he understood with no further questions.

## 2015-12-20 NOTE — Telephone Encounter (Signed)
CMA called [employer] interpreter and spoke to [place] CreekGloria, #[id number]. Interpreter called patient, patient didn't answer. A message was left for the patient to return my call. This is the 3rd attempt to reach patient. Patient will be sent a letter to address on file.

## 2015-12-21 ENCOUNTER — Telehealth: Payer: Self-pay

## 2015-12-21 NOTE — Telephone Encounter (Signed)
Opened in error

## 2015-12-26 ENCOUNTER — Telehealth: Payer: Self-pay | Admitting: Internal Medicine

## 2015-12-26 NOTE — Telephone Encounter (Signed)
Pt. Returned call. Pt. Is requesting her CT scan results. Please f/u

## 2015-12-27 NOTE — Telephone Encounter (Signed)
CMA called [employer] interpreter and spoke with Cris [phone]. Patient verified name and DOB. Patient was given lab results verbalized she understood with no further questions.

## 2016-03-19 ENCOUNTER — Ambulatory Visit (HOSPITAL_COMMUNITY)
Admission: RE | Admit: 2016-03-19 | Discharge: 2016-03-19 | Disposition: A | Discharge status: Home / Self Care | Payer: BLUE CROSS/BLUE SHIELD | Source: Ambulatory Visit | Attending: Internal Medicine | Admitting: Internal Medicine

## 2016-03-19 ENCOUNTER — Ambulatory Visit (HOSPITAL_BASED_OUTPATIENT_CLINIC_OR_DEPARTMENT_OTHER): Payer: BLUE CROSS/BLUE SHIELD | Admitting: Internal Medicine

## 2016-03-19 ENCOUNTER — Encounter: Payer: Self-pay | Admitting: Internal Medicine

## 2016-03-19 VITALS — BP 166/111 | HR 65 | Temp 98.7°F | Resp 18 | Ht 63.5 in | Wt 177.6 lb

## 2016-03-19 DIAGNOSIS — M545 Low back pain: Secondary | ICD-10-CM

## 2016-03-19 DIAGNOSIS — N949 Unspecified condition associated with female genital organs and menstrual cycle: Secondary | ICD-10-CM | POA: Insufficient documentation

## 2016-03-19 DIAGNOSIS — N200 Calculus of kidney: Secondary | ICD-10-CM | POA: Diagnosis not present

## 2016-03-19 DIAGNOSIS — I160 Hypertensive urgency: Secondary | ICD-10-CM | POA: Diagnosis not present

## 2016-03-19 MED ORDER — TRAMADOL HCL 50 MG PO TABS: 50.0000 mg | ORAL_TABLET | Freq: Three times a day (TID) | ORAL | Status: DC | PRN

## 2016-03-19 MED ORDER — BISOPROLOL-HYDROCHLOROTHIAZIDE 5-6.25 MG PO TABS: 1.0000 | ORAL_TABLET | Freq: Every day | ORAL | Status: DC

## 2016-03-19 MED ORDER — DICLOFENAC SODIUM 1 % TD GEL: 2.0000 g | Freq: Four times a day (QID) | TRANSDERMAL | Status: DC

## 2016-03-19 MED ORDER — CYCLOBENZAPRINE HCL 5 MG PO TABS: 5.0000 mg | ORAL_TABLET | Freq: Three times a day (TID) | ORAL | Status: DC | PRN

## 2016-03-19 MED FILL — CYCLOBENZAPRINE 5 MG TABLET: 5 | 10 days supply | Qty: 30 | Fill #0

## 2016-03-19 MED FILL — traMADol HCL 50 MG TABS: 50 | 15 days supply | Qty: 45 | Fill #0

## 2016-03-19 MED FILL — BISOPROLOL-HCTZ 5-6.25 MG T: 5-6.25 | 30 days supply | Qty: 30 | Fill #0

## 2016-03-19 NOTE — Progress Notes (Signed)
Debbie Stone, is a 45 y.o. female  ZOX:[id number]  [id number]:[id number]  DOB - 06-09-1971  Chief Complaint  Patient presents with  . Follow-up    4 week        Subjective:   Debbie Stone is a 45 y.o. female here today for a follow up visit for htn and back pain.  She never got the back lumbar films, did not know they were ordered.  Also, ran out of her bp meds few days ago as well.  No c/o today,  visual problems, headache. 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  Denies depression, just has pain issues.  Husband here w/ her.  Patient has No headache, No chest pain, No abdominal pain - No Nausea, No new weakness tingling or numbness, No Cough - SOB.  Interpreter was used to communicate directly with patient for the entire encounter including providing detailed patient instructions.  Interpreter also assisted w/ translating the pain contract as well.  Problem  Adnexal Cyst    ALLERGIES: No Known Allergies  PAST MEDICAL HISTORY: Past Medical History  Diagnosis Date  . Renal disorder     hx of kidney stones  . Hypertension     MEDICATIONS AT HOME: Prior to Admission medications   Medication Sig Start Date End Date Taking? Authorizing Provider  bisoprolol-hydrochlorothiazide (ZIAC) 5-6.25 MG tablet Take 1 tablet by mouth daily. 03/19/16   Pete Glatter, MD  cyclobenzaprine (FLEXERIL) 5 MG tablet Take 1 tablet (5 mg total) by mouth 3 (three) times daily as needed for muscle spasms. 03/19/16   Pete Glatter, MD  diclofenac sodium (VOLTAREN) 1 % GEL Apply 2 g topically 4 (four) times daily. 03/19/16   Pete Glatter, MD  Naproxen 375 MG TBEC Take 1 po q 12h prn pain. Take with food Patient not taking: Reported on 12/07/2015 08/15/15   Hayden Rasmussen, NP  ondansetron (ZOFRAN ODT) 4 MG disintegrating tablet Take 1 tablet (4 mg total) by mouth every 8 (eight) hours as needed for nausea  or vomiting. Patient not taking: Reported on 12/07/2015 01/16/15   Teressa Lower, NP  oxyCODONE-acetaminophen (PERCOCET/ROXICET) 5-325 MG per tablet Take 1-2 tablets by mouth every 6 (six) hours as needed for severe pain. Patient not taking: Reported on 12/07/2015 01/16/15   Teressa Lower, NP  traMADol (ULTRAM) 50 MG tablet Take 1 tablet (50 mg total) by mouth every 8 (eight) hours as needed. 03/19/16   Pete Glatter, MD     Objective:   Filed Vitals:   03/19/16 1141  BP: 166/111  Pulse: 65  Temp: 98.7 F (37.1 C)  TempSrc: Oral  Resp: 18  Height: 5' 3.5" (1.613 m)  Weight: 177 lb 9.6 oz (80.559 kg)  SpO2: 100%    Exam General appearance : Awake, alert, not in any distress. Speech Clear. Not toxic looking, obese HEENT: Atraumatic and Normocephalic, pupils equally reactive to light. Neck: supple, no JVD. Chest:Good air entry bilaterally, no added sounds. CVS: S1 S2 regular, no murmurs/gallups or rubs. Abdomen: Bowel sounds active, Non tender and not distended with no gaurding, rigidity or rebound. Back: ttp l4-5 spinal renal, nonradiating today. Extremities: B/L Lower Ext shows no edema, both legs are warm to touch Neurology: Awake alert, and oriented X 3, CN II-XII grossly intact, Non focal Skin:No Rash  Data Review Lab Results  Component Value Date   HGBA1C 5.3 12/07/2015  Depression screen PHQ 2/9 12/07/2015  Decreased Interest 0  Down, Depressed, Hopeless 0  PHQ - 2 Score 0    3/17 renal ct IMPRESSION: Left nephrolithiasis. There is a single dilated lower pole anterior calyx.  No evidence of right urinary obstruction.  3.6 cm cystic lesion in the right adnexa. Followup ultrasound in 6-12 weeks is recommended to ensure resolution.   Assessment & Plan   1. Hypertensive urgency, suspect worse w/ pain and ran out of meds few days ago - hr borderline so did not do clonidine. - bisoprolol-hydrochlorothiazide (ZIAC) 5-6.25 MG tablet; Take 1 tablet by  mouth daily.  Dispense: 90 tablet; Refill: 3 - rechk bp in 2wks - low salt diet reinforced,  2. Bilateral low back pain, with sciatica presence unspecified - DG Lumbar Spine Complete; Future  - reorderd - traMADol (ULTRAM) 50 MG tablet; Take 1 tablet (50 mg total) by mouth every 8 (eight) hours as needed.  Dispense: 45 tablet; Refill: 0 - cyclobenzaprine (FLEXERIL) 5 MG tablet; Take 1 tablet (5 mg total) by mouth 3 (three) times daily as needed for muscle spasms.  Dispense: 30 tablet; Refill: 0 - volterin gel trial as well - went over pain contract w/ patient and husband, with assistance of Interpreter, they are agreeable to sign.  Of note, if needs to see pain specialist/ortho based on xrays, this contract will be null and void if needs stronger pain rx. - info on back exercises provided as well.  3. Adnexal cyst, right, 3.6cm noted prior on CT - US Pelvis Complete; Future - ob transvaginal us as well,    4. Health maintenance - papsmear in 2 wks  Patient have been counseled extensively about nutrition and exercise  Return in about 2 weeks (around 04/02/2016) for htn /pap.  The patient was given clear instructions to go to ER or return to medical center if symptoms don't improve, worsen or new problems develop. The patient verbalized understanding. The patient was told to call to get lab results if they haven't heard anything in the next week.    Pete Glatterawn T Isrrael Fluckiger, MD, MBA/MHA [facility] [place], [place] [phone]   03/19/2016, 12:24 PM

## 2016-03-19 NOTE — Patient Instructions (Signed)
Plan de alimentacin con bajo contenido de sodio (Low-Sodium Eating Plan) El sodio aumenta la presin arterial y hace que el cuerpo retenga lquidos. El consumo de alimentos con menos sodio ayuda a Conservator, museum/gallery presin arterial, a Building services engineer y a Physicist, medical, el hgado y los riones. Agregar sal (cloruro de sodio) a los alimentos aumenta el aporte de [facility]. La mayor parte del sodio proviene de los alimentos enlatados, envasados y congelados. La pizza, la comida rpida y la comida de los restaurantes tambin contienen mucho sodio. Aunque usted tome medicamentos para bajar la presin arterial o reducir el lquido del cuerpo, es importante que disminuya el aporte de sodio de los alimentos. EN QU CONSISTE EL PLAN? La [employer]ley[employer]son de las personas deberan limitar la ingesta de sodio a 2300mg  por Futures trader. El mdico le recomienda que limite su consumo de sodio a 2000 mg por Futures trader.  QU DEBO SABER ACERCA DE ESTE PLAN DE ALIMENTACIN? Para el plan de alimentacin con bajo contenido de sodio, debe seguir estas pautas generales: 1. Elija alimentos con un valor porcentual diario de sodio de menos del 5% (segn se indica en la etiqueta). 2. Use hierbas o aderezos sin sal, en lugar de sal de mesa o sal marina. 3. Consulte al mdico o farmacutico antes de usar sustitutos de la sal. 4. Coma alimentos frescos. 5. Coma ms frutas y verduras. 6. Limite las verduras enlatadas. Si las consume, enjuguelas bien para disminuir el sodio. 7. Limite el consumo de queso a 1onza (28g) por da. 8. Coma productos con bajo contenido de sodio, cuya etiqueta suele decir "bajo contenido de sodio" o "sin agregado de sal". 9. Evite alimentos que contengan glutamato monosdico (MSG), que a veces se agrega a la comida [place] y a algunos alimentos enlatados. 10. Consulte las etiquetas de los alimentos (etiquetas de informacin nutricional) para saber cunto sodio contiene una porcin. 11. Consuma ms comida casera y menos de  restaurante, de buf y comida rpida. 12. Cuando coma en un restaurante, pida que preparen su comida con menos sal o, en lo posible, sin nada de sal. CMO LEO LA INFORMACIN SOBRE EL SODIO EN LAS ETIQUETAS DE LOS ALIMENTOS? La etiqueta de informacin nutricional indica la cantidad de sodio en una porcin de alimento. Si come ms de una porcin, debe multiplicar la cantidad indicada de sodio por la cantidad de porciones. Las etiquetas de los alimentos tambin pueden indicar lo siguiente: 1. Sin sodio: menos de 5mg  por porcin. 2. Cantidad muy baja de sodio: 35mg  o menos por porcin. 3. Cantidad baja de sodio: 140mg  o menos por porcin. 4. Menor cantidad de sodio: 50% menos de sodio en una porcin. Por ejemplo, si un alimento generalmente contiene 300 mg de sodio se modifica para ser [employer], tendr 150 mg de sodio. 5. Sodio reducido: 25% menos de [employer]. Por ejemplo, si un alimento que por lo general contiene 400mg  de sodio se modifica para convertirse en un alimento de sodio reducido, tendr 300mg  de sodio. QU ALIMENTOS PUEDO COMER? Cereales Cereales con bajo contenido de sodio, como [place], arroz y trigo [place], y trigo triturado. Galletas con bajo contenido de [place]. Arroz y pastas sin sal. Pan con bajo contenido de [place].  Verduras Verduras frescas o congeladas. Verduras enlatadas con bajo contenido de sodio o reducido de sodio. Pasta y salsa de tomate con contenido bajo o reducido de sodio. Jugos de tomate y verduras con contenido bajo o reducido de sodio.  Nils Pyle Frutas frescas,  congeladas y enlatadas. Jugo de frutas.  Carnes y otros productos con protenas Atn y salmn enlatado con bajo contenido de [place]odio. Carne de vaca o ave, pescado y frutos de mar frescos o congelados. Cordero. Frutos secos sin sal. Lentejas, frijoles y guisantes secos, sin sal agregada. Frijoles enlatados sin sal. Sopas caseras sin sal. Huevos.  Lcteos Leche. Leche de soja.  Queso ricota. Quesos con contenido bajo o reducido de sodio. Yogur.  Condimentos Hierbas y especias frescas y secas. Aderezos sin sal. [place]ebolla y ajo en polvo. Variedades de mostaza y ketchup con bajo contenido de [place]. Rbano picante fresco o refrigerado. Jugo de limn.  Grasas y aceites Aderezos para ensalada con contenido reducido de [place]ige[place]odio. Mantequilla sin sal.  Otros Palomitas de maz y pretzels sin sal.  Los artculos mencionados arriba pueden no ser Raytheonuna lista completa de las bebidas o los alimentos recomendados. Comunquese con el nutricionista para conocer ms opciones. QU ALIMENTOS NO SE RECOMIENDAN? Cereales  Cereales instantneos para comer caliente. Mezclas para bizcochos, panqueques y rellenos de pan. Crutones. Mezclas para pastas o arroz con condimento. Envases comerciales de sopa de fideos. Macarrones con queso envasados o congelados. Harina leudante. Galletas saladas comunes. Verduras Verduras enlatadas comunes. Pasta y salsa de tomate en lata comunes. Jugos comunes de tomate y de verduras. Verduras Hydrologistcongeladas en salsa. Papas fritas saladas. Aceitunas. Pepinillos. Salsas. Chucrut. Salsa. Carnes y otros productos con protenas Carne de vaca, pescado o frutos de mar que est salada, [place], [place]ada, condimentada con especias o con pickles. Panceta, jamn, salchichas, perros calientes, carne curada, carne picada (carne envasada de buey) y embutidos. Cerdo salado. Cecina o charqui. Arenque en escabeche. Anchoas, atn enlatado comn y sardinas. Frutos secos con sal. Celine MansLcteos Quesos para untar y quesos procesados. Requesn. Queso azul y cottage. Suero de [place].  Condimentos Sal de cebolla y ajo, sal condimentada, sal de mesa y sal marina. Salsas en lata y envasadas. Salsa [place]. Salsa trtara. Salsa barbacoa. Salsa teriyaki. Salsa de soja, incluso la que tiene contenido reducido de [place] Lakesodio. Salsa de carne. Salsa de pescado. Salsa de [place] High[place]ostras. Salsa rosada. Rbano picante envasado.  Ketchup y mostaza comunes. Saborizantes y tiernizantes para carne. Caldo en cubitos. Salsa picante. Salsa tabasco. Adobos. Aderezos para tacos. Salsas. Grasas y aceites Aderezos comunes para ensalada. Mantequilla con sal. Margarina. Mantequilla clarificada. Grasa de panceta.  Otros Nachos y papas fritas envasadas. Maz inflado y frituras de maz. Palomitas de maz y pretzels con sal. Sopas enlatadas o en polvo. Pizza. Pasteles y entradas congeladas.  Los artculos mencionados arriba pueden no ser Raytheonuna lista completa de las bebidas y los alimentos que se Theatre stage managerdeben evitar. Comunquese con el nutricionista para obtener ms informacin.   Esta informacin no tiene Theme park managercomo fin reemplazar el consejo del mdico. Asegrese de hacerle al mdico cualquier pregunta que tenga.   Document Released: 09/17/2005 Document Revised: 10/08/2014 Elsevier Interactive Patient Education 2016 [employer]   - Ejercicios para la espalda (Back Exercises) Si tiene dolor de espalda, haga estos ejercicios 2 o 3veces por da, o como se lo haya indicado el mdico. Cuando el dolor desaparezca, hgalos una vez por da, pero haga ms repeticiones de cada ejercicio. Si no le duele la espalda, haga estos ejercicios una vez por da o como se lo haya indicado el mdico. EJERCICIOS Rodilla al pecho Repita estos pasos 3 o 5veces seguidas con cada pierna: 13. Acustese boca arriba sobre una cama dura o sobre el suelo con las piernas extendidas. 14. Lleve una rodilla al pecho. 15. Mantenga  la rodilla contra el pecho. Para lograrlo tmese la rodilla o el muslo. 16. Tire de la rodilla hasta sentir una elongacin suave en la parte baja de la espalda. 17. Mantenga la elongacin durante 10 a 30segundos. 18. Suelte y extienda la pierna lentamente. Inclinacin de la pelvis Repita estos pasos 5 o 10veces seguidas: 6. Acustese boca arriba sobre una cama dura o sobre el suelo con las piernas extendidas. 7. Flexione las rodillas de manera  que apunten al techo. Los pies deben estar apoyados en el suelo. 8. Contraiga los msculos de la parte baja del vientre (abdomen) para empujar la zona lumbar contra el suelo. Este movimiento har que el cccix apunte hacia el techo, en lugar de apuntar hacia abajo en direccin a los pies o al suelo. 9. Mantenga esta posicin durante 5 a 10segundos mientras contrae suavemente los msculos y respira con normalidad. El perro y el gato Repita estos pasos hasta que la zona lumbar se curve con ms facilidad: 1. Apoye las palmas de las manos y las rodillas sobre una superficie firme. Las manos deben estar alineadas con los hombros y las rodillas con las caderas. Puede colocarse almohadillas debajo de las rodillas. 2. Deje caer la cabeza y lleve el cccix hacia abajo de modo que apunte en direccin al suelo para que la zona lumbar se arquee como el lomo de un gato [place]. 3. Mantenga esta posicin durante 5segundos. 4. Lentamente, levante la cabeza y lleve el cccix hacia arriba de modo que apunte en direccin al techo para que la espalda se arquee (hunda) como el lomo de un perro contento. 5. Mantenga esta posicin durante 5segundos. Flexiones de brazos Repita estos pasos 5 o 10veces seguidas: 1. Acustese boca abajo en el suelo. 2. Ponga las manos cerca de la cabeza, separadas aproximadamente al ancho de los hombros. 3. Con la espalda relajada y las caderas apoyadas en el suelo, extienda lentamente los brazos para levantar la mitad superior del cuerpo y Optometrist los hombros. No use los msculos de la espalda. Para estar ms cmodo, puede cambiar la [employer]. 4. Mantenga esta posicin durante 5segundos. 5. Lentamente vuelva a la posicin horizontal. Puentes Repita estos pasos 10veces seguidas: 1. Acustese boca arriba sobre una superficie firme. 2. Flexione las rodillas de manera que apunten al techo. Los pies deben estar apoyados en el suelo. 3. Contraiga los glteos y despegue las  nalgas del suelo hasta que la cintura est casi a la altura de las rodillas. Si no siente el trabajo muscular en las nalgas y la parte posterior de los muslos, aleje los pies 1 o 2pulgadas (2,5 o 5centmetros) de las nalgas. 4. Mantenga esta posicin durante 3 a 5segundos. 5. Lentamente, vuelva a apoyar las nalgas en el suelo y relaje los glteos. Si este ejercicio le resulta muy fcil, intente realizarlo con los brazos cruzados [place]. Abdominales Repita estos pasos 5 o 10veces seguidas: 1. Acustese boca arriba sobre una cama dura o sobre el suelo con las piernas extendidas. 2. Flexione las rodillas de manera que apunten al techo. Los pies deben estar apoyados en el suelo. 3. Cruce los [employer]. 4. Baje levemente el mentn en direccin al pecho, pero no doble el cuello. 5. Contraiga los msculos del abdomen y con lentitud eleve el pecho lo suficiente como para despegar levemente los omplatos del suelo. 6. Lentamente baje el pecho y la cabeza hasta el suelo. Elevaciones de espalda Repita estos pasos 5 o 10veces seguidas: 1.  Acustese boca abajo con los brazos a los costados y apoye la frente en el suelo. 2. Contraiga los msculos de las piernas y los glteos. 3. Lentamente despegue el pecho del suelo mientras mantiene las caderas apoyadas en el suelo. Mantenga la nuca alineada con la curvatura de la espalda. Mire hacia el suelo mientras hace este ejercicio. 4. Mantenga esta posicin durante 3 a 5segundos. 5. Lentamente baje el pecho y el rostro hasta el suelo. SOLICITE AYUDA SI:  El dolor de espalda se vuelve mucho ms intenso cuando hace un ejercicio.  El dolor de espalda no se [place] 2horas despus de [employer] ejercicios. Si tiene alguno de [employer] Brands, deje de [employer] ejercicios. No vuelva a hacer los ejercicios a menos que el mdico lo autorice. SOLICITE AYUDA DE INMEDIATO SI:  Siente un dolor sbito y muy intenso en la espalda. Si esto ocurre, deje  de [employer]. No vuelva a hacer los ejercicios a menos que el mdico lo autorice.   Esta informacin no tiene Theme park manager el consejo del mdico. Asegrese de hacerle al mdico cualquier pregunta que tenga.   Document Released: 01/02/2011 Document Revised: 06/08/2015 Elsevier Interactive Patient Education [employer]

## 2016-03-19 NOTE — Progress Notes (Signed)
Patient is here for 4 week FU  Patient has not taken medication today and patient has not eaten.  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

## 2016-03-19 NOTE — Addendum Note (Signed)
Addended byDierdre Searles: Debbie Stone on: 03/19/2016 01:18 PM   Modules accepted: Orders

## 2016-03-26 ENCOUNTER — Telehealth: Payer: Self-pay

## 2016-03-26 ENCOUNTER — Other Ambulatory Visit: Payer: Self-pay | Admitting: Internal Medicine

## 2016-03-26 MED ORDER — CAPSAICIN-MENTHOL-METHYL SAL 0.025-1-12 % EX CREA: 1.0000 "application " | TOPICAL_CREAM | Freq: Four times a day (QID) | CUTANEOUS | Status: DC | PRN

## 2016-03-26 NOTE — Telephone Encounter (Signed)
-----   Message from Dawn T Langeland, MD sent at 03/26/2016 10:39 AM EDT ----- Please call pt/ her volterin gel was denied by insurance.  i put in rx for capsaisin cream to see if covered, but also available as otc as well. thanks 

## 2016-03-26 NOTE — Telephone Encounter (Signed)
Clld pt  Used [employer] Jorge ID# [id number]

## 2016-03-27 NOTE — Telephone Encounter (Signed)
-----   Message from [name], MD sent at 03/20/2016  9:13 AM EDT ----- Please call pt w/ xray results. Will need interpreter. Her back xray was normal, so I suspect all her pain due to msk.  Warm heat best as often as able to loosen muscles, RICE,, try to voltarin gel i prescribed as well, back stretching exercises as well,  Thanks.

## 2016-03-27 NOTE — Telephone Encounter (Signed)
Medical Assistant used [employer] to contact patient.  Interpreter Name: Rafal Interpreter #: [id number]  Medical Assistant left message on patient's home and cell voicemail. Voicemail states to give a call back to Debbie Stone with [facility] at [phone].    

## 2016-03-28 ENCOUNTER — Telehealth: Payer: Self-pay

## 2016-03-28 NOTE — Telephone Encounter (Signed)
Clld pt Used [employer] [place] ID# [id number]  Advsd Voltaren gel wad denied by her ins; Rx Capsaisin cream has been sent to her pharmacy to see if her insurance will cover it. The cream is also sold OTC if declined. Also, advsd pt that the back xray was normal. Pt stated she understood and would try the Capsaicin if approved by her insurance. Pt advsd to also follow up if her pain does not get any better. Pt stated she would.

## 2016-03-28 NOTE — Telephone Encounter (Signed)
-----   Message from [name], MD sent at 03/26/2016 10:39 AM EDT ----- Please call pt/ her volterin gel was denied by insurance.  i put in rx for capsaisin cream to see if covered, but also available as otc as well. thanks

## 2016-04-06 ENCOUNTER — Telehealth: Payer: Self-pay | Admitting: Internal Medicine

## 2016-04-06 ENCOUNTER — Ambulatory Visit (HOSPITAL_COMMUNITY)
Admission: RE | Admit: 2016-04-06 | Discharge: 2016-04-06 | Disposition: A | Discharge status: Home / Self Care | Payer: BLUE CROSS/BLUE SHIELD | Source: Ambulatory Visit | Attending: Internal Medicine | Admitting: Internal Medicine

## 2016-04-06 DIAGNOSIS — N949 Unspecified condition associated with female genital organs and menstrual cycle: Secondary | ICD-10-CM

## 2016-04-06 DIAGNOSIS — N83291 Other ovarian cyst, right side: Secondary | ICD-10-CM | POA: Diagnosis not present

## 2016-04-06 NOTE — Telephone Encounter (Signed)
Refer to previous telephone note.  Pt was advsd Voltaren was declined by her insurance on 03/28/16. Pt was advsd Rx Capsaisin was sent to her pharmacy...if declined she can purchase OTC.

## 2016-04-06 NOTE — Telephone Encounter (Signed)
Patient is needing diclofenac. Please follow up.

## 2016-04-20 ENCOUNTER — Telehealth: Payer: Self-pay | Admitting: Internal Medicine

## 2016-04-20 NOTE — Telephone Encounter (Signed)
Pt came into office requesting US Pelvis results, please f/up

## 2016-04-23 NOTE — Telephone Encounter (Signed)
Pt. Came into facility requesting her pelvis results.  Please f/u

## 2016-04-24 NOTE — Telephone Encounter (Signed)
Medical Assistant used [employer] to contact patient.  Interpreter Name: Hilma Favors #: [id number]  Patient verified DOB Patient is aware of US showing a benign simple cyst on the right side which is 3.5cm and patient is aware of the findings being the same as the [place] in March 2017. Patient is aware of uterus and left ovary being normal. Patient advised to contact [facility] to schedule pap smear and HTN FU. Patients states she will call tomorrow to schedule appointment. No further questions at this time.

## 2016-05-08 ENCOUNTER — Ambulatory Visit: Payer: BLUE CROSS/BLUE SHIELD | Admitting: Internal Medicine

## 2016-05-23 ENCOUNTER — Encounter: Payer: Self-pay | Admitting: Internal Medicine

## 2016-05-23 ENCOUNTER — Other Ambulatory Visit (HOSPITAL_COMMUNITY)
Admission: RE | Admit: 2016-05-23 | Discharge: 2016-05-23 | Disposition: A | Discharge status: Home / Self Care | Payer: BLUE CROSS/BLUE SHIELD | Source: Ambulatory Visit | Attending: Internal Medicine | Admitting: Internal Medicine

## 2016-05-23 ENCOUNTER — Ambulatory Visit: Payer: BLUE CROSS/BLUE SHIELD | Attending: Internal Medicine | Admitting: Internal Medicine

## 2016-05-23 VITALS — BP 166/130 | HR 59 | Temp 98.3°F | Resp 16 | Wt 165.4 lb

## 2016-05-23 DIAGNOSIS — Z01419 Encounter for gynecological examination (general) (routine) without abnormal findings: Secondary | ICD-10-CM | POA: Diagnosis present

## 2016-05-23 DIAGNOSIS — I160 Hypertensive urgency: Secondary | ICD-10-CM | POA: Diagnosis not present

## 2016-05-23 DIAGNOSIS — Z1151 Encounter for screening for human papillomavirus (HPV): Secondary | ICD-10-CM | POA: Insufficient documentation

## 2016-05-23 DIAGNOSIS — Z113 Encounter for screening for infections with a predominantly sexual mode of transmission: Secondary | ICD-10-CM | POA: Insufficient documentation

## 2016-05-23 DIAGNOSIS — G8929 Other chronic pain: Secondary | ICD-10-CM | POA: Diagnosis not present

## 2016-05-23 DIAGNOSIS — N760 Acute vaginitis: Secondary | ICD-10-CM | POA: Diagnosis present

## 2016-05-23 DIAGNOSIS — Z124 Encounter for screening for malignant neoplasm of cervix: Secondary | ICD-10-CM

## 2016-05-23 DIAGNOSIS — M545 Low back pain: Secondary | ICD-10-CM

## 2016-05-23 MED ORDER — LISINOPRIL-HYDROCHLOROTHIAZIDE 20-25 MG PO TABS: 1.0000 | ORAL_TABLET | Freq: Every day | ORAL | 3 refills | Status: DC

## 2016-05-23 MED FILL — LISINOPRIL-HCTZ 20-25 MG TA: 20-25 | 90 days supply | Qty: 90 | Fill #0

## 2016-05-23 NOTE — Progress Notes (Signed)
Debbie Stone, is a 45 y.o. female  [id number]  [id number]  DOB - 1970/11/03  Chief Complaint  Patient presents with  . Gynecologic Exam        Subjective:   Debbie Stone is a 45 y.o. female here today for a follow up visit for Pap smear and hypertension. Patient states she forgot to take her blood pressure medicine this morning when she was running out of the house   She had her finish her menses yesterday. No abml discharge.  Last papsmear likely 2007 per pt.  She does not eat a lot of fast food, but they do eat a lot of canned foods including canned beans.   She states she still has some mild chronic back pain, but not so bad, mainly on the right side. Now whenever she does some lifting at work. She works as a Advertising copywriterhousekeeper. She says she just takes ibuprofen now and does be helped relieve the pain quite a bit    Patient has No headache, No chest pain, No abdominal pain - No Nausea, No new weakness tingling or numbness, No Cough - SOB.  Problem  Chronic Lower Back Pain    ALLERGIES: No Known Allergies  PAST MEDICAL HISTORY: Past Medical History:  Diagnosis Date  . Hypertension   . Renal disorder    hx of kidney stones    MEDICATIONS AT HOME: Prior to Admission medications   Medication Sig Start Date End Date Taking? Authorizing Provider  Capsaicin-Menthol-Methyl Sal (CAPSAICIN-METHYL SAL-MENTHOL) 0.025-1-12 % CREA Apply 1 application topically 4 (four) times daily as needed. 03/26/16   Pete Glatterawn T Kadarrius Yanke, MD  cyclobenzaprine (FLEXERIL) 5 MG tablet Take 1 tablet (5 mg total) by mouth 3 (three) times daily as needed for muscle spasms. 03/19/16   Pete Glatterawn T Dondre Catalfamo, MD  lisinopril-hydrochlorothiazide (PRINZIDE,ZESTORETIC) 20-25 MG tablet Take 1 tablet by mouth daily. 05/23/16   Pete Glatterawn T Shamarie Call, MD  Naproxen 375 MG TBEC Take 1 po q 12h prn pain. Take with food Patient not taking: Reported on 12/07/2015 08/15/15   Hayden Rasmussenavid Mabe, NP  ondansetron  (ZOFRAN ODT) 4 MG disintegrating tablet Take 1 tablet (4 mg total) by mouth every 8 (eight) hours as needed for nausea or vomiting. Patient not taking: Reported on 12/07/2015 01/16/15   Teressa LowerVrinda Pickering, NP  oxyCODONE-acetaminophen (PERCOCET/ROXICET) 5-325 MG per tablet Take 1-2 tablets by mouth every 6 (six) hours as needed for severe pain. Patient not taking: Reported on 12/07/2015 01/16/15   Teressa LowerVrinda Pickering, NP  traMADol (ULTRAM) 50 MG tablet Take 1 tablet (50 mg total) by mouth every 8 (eight) hours as needed. 03/19/16   Pete Glatterawn T Zehava Turski, MD     Objective:   Vitals:   05/23/16 1139 05/23/16 1140  BP: (!) 178/123 (!) 166/130  Pulse: (!) 59   Resp: 16   Temp: 98.3 F (36.8 C)   TempSrc: Oral   SpO2: 97%   Weight: 165 lb 6.4 oz (75 kg)    cma assisted w/ pap smear.  Exam General appearance : Awake, alert, not in any distress. Speech Clear. Not toxic looking, pleasant. HEENT: Atraumatic and Normocephalic, pupils equally reactive to light. Neck: supple, no JVD.  Breast/axilla exam: bilat, no palpable masses appreciated, no abnml nipple discharge Chest:Good air entry bilaterally, no added sounds. CVS: S1 S2 regular, no murmurs/gallups or rubs. Abdomen: Bowel sounds active, obese, Non tender and not distended with no gaurding, rigidity or rebound. Pelvic Exam: Cervix normal in appearance, external genitalia normal,  no adnexal masses or tenderness, no cervical motion tenderness, rectovaginal septum normal, uterus normal size, shape, and consistency and vagina normal with clear white discharge  Extremities: B/L Lower Ext shows no edema, both legs are warm to touch Neurology: Awake alert, and oriented X 3, CN II-XII grossly intact, Non focal Skin:No Rash  Data Review Lab Results  Component Value Date   HGBA1C 5.3 12/07/2015    [facility] 05/23/2016 12/07/2015  Decreased Interest 0 0  Down, Depressed, Hopeless 0 0  PHQ - 2 Score 0 0      Assessment & Plan   1. Pap  smear for cervical cancer screening - Cytology - PAP  2. Hypertensive urgency Unable to received clonidine due to bradycardia.  However, suspect current home rx not wking, per pt only missed this am. - dc ziac - start prinzide 20-25qd - low salt diet dw pt at length  3. Chronic lower back pain Appears to be improve w/ just aleve prn. rx for waist belt given, works as Advertising copywriterhousekeeper, needs added support.     Patient have been counseled extensively about nutrition and exercise  Return in about 4 weeks (around 06/20/2016) for htn.  The patient was given clear instructions to go to ER or return to medical center if symptoms don't improve, worsen or new problems develop. The patient verbalized understanding. The patient was told to call to get lab results if they haven't heard anything in the next week.   This note has been created with Education officer, environmentalDragon speech recognition software and smart phrase technology. Any transcriptional errors are unintentional.   Pete Glatterawn T Quinnetta Roepke, MD, MBA/MHA [facility] [place], [place] [phone]   05/23/2016, 12:32 PM

## 2016-05-23 NOTE — Patient Instructions (Signed)
Plan de alimentacin con bajo contenido de sodio (Low-Sodium Eating Plan) El sodio aumenta la presin arterial y hace que el cuerpo retenga lquidos. El consumo de alimentos con menos sodio ayuda a Armed forces technical officer presin arterial, a Forensic psychologist y a Tour manager, el hgado y los riones. Agregar sal (cloruro de sodio) a los alimentos aumenta el aporte de [place]. La mayor parte del sodio proviene de los alimentos enlatados, envasados y congelados. La pizza, la comida rpida y la comida de los restaurantes tambin contienen mucho sodio. Aunque usted tome medicamentos para bajar la presin arterial o reducir el lquido del cuerpo, es importante que disminuya el aporte de sodio de los alimentos. EN QU CONSISTE EL PLAN? La [employer] de las personas deberan limitar la ingesta de sodio a 2300mg  por Training and development officer. El mdico le recomienda que limite su consumo de sodio a 2000mg  por Training and development officer.  QU DEBO SABER ACERCA DE ESTE PLAN DE [place]? Para el plan de alimentacin con bajo contenido de sodio, debe seguir estas pautas generales:  Elija alimentos con un valor porcentual diario de sodio de menos del 5% (segn se indica en la etiqueta).  Use hierbas o aderezos sin sal, en lugar de sal de mesa o sal marina.  Consulte al mdico o farmacutico antes de usar sustitutos de la sal.  Coma alimentos frescos.  Coma ms frutas y verduras.  Limite las verduras enlatadas. Si las consume, enjuguelas bien para disminuir el sodio.  Limite el consumo de queso a 1onza (28g) por Training and development officer.  Coma productos con bajo contenido de sodio, cuya etiqueta suele decir "bajo contenido de sodio" o "sin agregado de sal".  Evite alimentos que contengan glutamato monosdico (MSG), que a veces se agrega a la comida [place] y a algunos alimentos enlatados.  Consulte las etiquetas de los alimentos (etiquetas de informacin nutricional) para saber cunto sodio contiene una porcin.  Consuma ms comida casera y menos de restaurante,  de buf y comida rpida.  Cuando coma en un restaurante, pida que preparen su comida con menos sal o, en lo posible, sin nada de sal. CMO LEO LA INFORMACIN SOBRE EL SODIO EN LAS ETIQUETAS DE LOS ALIMENTOS? La etiqueta de informacin nutricional indica la cantidad de sodio en una porcin de alimento. Si come ms de una porcin, debe multiplicar la cantidad indicada de sodio por la cantidad de porciones. Las etiquetas de los alimentos tambin pueden indicar lo siguiente:  Sin sodio: menos de 5mg  por porcin.  Cantidad muy baja de sodio: 35mg  o menos por porcin.  Cantidad baja de sodio: 140mg  o menos por porcin.  Menor cantidad de sodio: 50% menos de sodio en una porcin. Por ejemplo, si un alimento generalmente contiene 300 mg de sodio se modifica para ser [employer], tendr 150 mg de sodio.  Sodio reducido: 25% menos de Agricultural consultant. Por ejemplo, si un alimento que por lo general contiene 400mg  de sodio se modifica para convertirse en un alimento de sodio reducido, tendr 300mg  de sodio. QU ALIMENTOS PUEDO COMER? Cereales Cereales con bajo contenido de sodio, como [place], arroz y trigo [place], y trigo triturado. Galletas con bajo contenido de [place]. Arroz y pastas sin sal. Pan con bajo contenido de [place].  Verduras Verduras frescas o congeladas. Verduras enlatadas con bajo contenido de sodio o reducido de sodio. Pasta y salsa de tomate con contenido bajo o [place]. Jugos de tomate y verduras con contenido bajo o reducido de sodio.  Lambert Mody Frutas frescas, congeladas  y enlatadas. Jugo de frutas.  Carnes y otros productos con protenas Atn y salmn enlatado con bajo contenido de [place] Cree[place]odio. Carne de vaca o ave, pescado y frutos de mar frescos o congelados. Cordero. Frutos secos sin sal. Lentejas, frijoles y guisantes secos, sin sal agregada. Frijoles enlatados sin sal. Sopas caseras sin sal. Huevos.  Lcteos Leche. Leche de soja. Queso ricota. Quesos  con contenido bajo o reducido de sodio. Yogur.  Condimentos Hierbas y especias frescas y secas. Aderezos sin sal. Cebolla y ajo en polvo. Variedades de mostaza y ketchup con bajo contenido de Ari[place]odio. Rbano picante fresco o refrigerado. Jugo de limn.  Grasas y aceites Aderezos para ensalada con contenido reducido de [place]io. Mantequilla sin sal.  Otros Palomitas de maz y pretzels sin sal.  Los artculos mencionados arriba pueden no ser Raytheonuna lista completa de las bebidas o los alimentos recomendados. Comunquese con el nutricionista para conocer ms opciones. QU ALIMENTOS NO SE RECOMIENDAN? Cereales  Cereales instantneos para comer caliente. Mezclas para bizcochos, panqueques y rellenos de pan. Crutones. Mezclas para pastas o arroz con condimento. Envases comerciales de sopa de fideos. Macarrones con queso envasados o congelados. Harina leudante. Galletas saladas comunes. Verduras Verduras enlatadas comunes. Pasta y salsa de tomate en lata comunes. Jugos comunes de tomate y de verduras. Verduras Hydrologistcongeladas en salsa. Papas fritas saladas. Aceitunas. Pepinillos. Salsas. Chucrut. Salsa. Carnes y otros productos con protenas Carne de vaca, pescado o frutos de mar que est salada, [place] Springsenlatada, Mann[place] Choiceahumada, condimentada con especias o con pickles. Panceta, jamn, salchichas, perros calientes, carne curada, carne picada (carne envasada de buey) y embutidos. Cerdo salado. Cecina o charqui. Arenque en escabeche. Anchoas, atn enlatado comn y sardinas. Frutos secos con sal. Celine MansLcteos Quesos para untar y quesos procesados. Requesn. Queso azul y cottage. Suero de [place].  Condimentos Sal de cebolla y ajo, sal condimentada, sal de mesa y sal marina. Salsas en lata y envasadas. Salsa [place]. Salsa trtara. Salsa barbacoa. Salsa teriyaki. Salsa de soja, incluso la que tiene contenido reducido de [place] Heightssodio. Salsa de carne. Salsa de pescado. Salsa de [place]. Salsa rosada. Rbano picante envasado. Ketchup y mostaza  comunes. Saborizantes y tiernizantes para carne. Caldo en cubitos. Salsa picante. Salsa tabasco. Adobos. Aderezos para tacos. Salsas. Grasas y aceites Aderezos comunes para ensalada. Mantequilla con sal. Margarina. Mantequilla clarificada. Grasa de panceta.  Otros Nachos y papas fritas envasadas. Maz inflado y frituras de maz. Palomitas de maz y pretzels con sal. Sopas enlatadas o en polvo. Pizza. Pasteles y entradas congeladas.  Los artculos mencionados arriba pueden no ser Raytheonuna lista completa de las bebidas y los alimentos que se Theatre stage managerdeben evitar. Comunquese con el nutricionista para obtener ms informacin.   Esta informacin no tiene Theme park managercomo fin reemplazar el consejo del mdico. Asegrese de hacerle al mdico cualquier pregunta que tenga.   Document Released: 09/17/2005 Document Revised: 10/08/2014 Elsevier Interactive Patient Education 2016 [employer]  -  Hipertensin (Hypertension) El trmino hipertensin es otra forma de denominar a la presin arterial elevada. La presin arterial elevada fuerza al corazn a trabajar ms para bombear la sangre. Una lectura de la presin arterial consta de dos nmeros: uno ms alto sobre uno ms bajo (por ejemplo, 110/72). CUIDADOS EN EL HOGAR   Haga que el mdico le tome nuevamente la presin arterial.  Tome los medicamentos solamente como se lo haya indicado el mdico. Siga cuidadosamente las indicaciones. Los medicamentos pierden eficacia si omite dosis. El hecho de omitir las dosis tambin Lesothoaumenta el riesgo de otros problemas.  No  fume.  Contrlese la presin arterial en su casa como se lo haya indicado el mdico. SOLICITE AYUDA SI:  Piensa que tiene una reaccin a los medicamentos que est tomando.  Tiene mareos o dolores de cabeza reiterados.  Se le inflaman (hinchan) los tobillos.  Tiene problemas de visin. SOLICITE AYUDA DE INMEDIATO SI:   Tiene un dolor de cabeza muy intenso y est confundido.  Se siente dbil, aturdido o se  desmaya.  Tiene dolor en el pecho o el estmago (abdominal).  Tiene vmitos.  No puede respirar Kimberly-Clarkmuy bien. ASEGRESE DE QUE:   Comprende estas instrucciones.  Controlar su afeccin.  Recibir ayuda de inmediato si no mejora o si empeora.   Esta informacin no tiene Theme park managercomo fin reemplazar el consejo del mdico. Asegrese de hacerle al mdico cualquier pregunta que tenga.   Document Released: 03/07/2010 Document Revised: 09/22/2013 Elsevier Interactive Patient Education [employer]2016 Elsevier Inc.

## 2016-05-23 NOTE — Progress Notes (Signed)
Pt is in the office today for annual pap Pt states she is not in any pain

## 2016-05-24 LAB — CERVICOVAGINAL ANCILLARY ONLY: WET PREP (BD AFFIRM): POSITIVE — AB

## 2016-05-24 LAB — CYTOLOGY - PAP

## 2016-05-25 ENCOUNTER — Other Ambulatory Visit: Payer: Self-pay | Admitting: Internal Medicine

## 2016-05-25 ENCOUNTER — Telehealth: Payer: Self-pay

## 2016-05-25 MED ORDER — METRONIDAZOLE 500 MG PO TABS: 500.0000 mg | ORAL_TABLET | Freq: Two times a day (BID) | ORAL | 0 refills | Status: DC

## 2016-05-25 MED FILL — metroNIDAZOLE 500 MG TABS: 500 | 7 days supply | Qty: 14 | Fill #0

## 2016-05-25 NOTE — Telephone Encounter (Signed)
[employer] [place] Id: [phone] contacted pt to go over lab results pt is aware of lab results also she is also aware of the rx sent to the pharmacy and the pap is still pending pt doesn't have any questions or concerns

## 2016-05-28 LAB — CERVICOVAGINAL ANCILLARY ONLY: Herpes: NEGATIVE

## 2016-05-30 ENCOUNTER — Telehealth: Payer: Self-pay

## 2016-05-30 NOTE — Telephone Encounter (Signed)
[employer] [place] Id: [id number] Contacted pt to go over lab results pt did not answer lvm for pt to give me a call at her earliest convenience

## 2016-06-06 NOTE — Progress Notes (Signed)
Letter generated

## 2016-06-11 ENCOUNTER — Telehealth: Payer: Self-pay | Admitting: Internal Medicine

## 2016-06-11 NOTE — Telephone Encounter (Signed)
Pt. Called requesting her lab results. Please f/u with pt.  °

## 2016-06-13 ENCOUNTER — Telehealth: Payer: Self-pay

## 2016-06-13 NOTE — Telephone Encounter (Signed)
Pt called back requesting results pt has received letter to call back . Please f/up

## 2016-06-13 NOTE — Telephone Encounter (Signed)
[employer] [place] Id: [id number] contacted pt to go over lab results pt is aware and doesn't have questions or concerns

## 2016-06-13 NOTE — Telephone Encounter (Signed)
Duplicate note

## 2016-06-27 ENCOUNTER — Ambulatory Visit: Payer: BLUE CROSS/BLUE SHIELD | Admitting: Internal Medicine

## 2016-07-05 ENCOUNTER — Ambulatory Visit: Payer: BLUE CROSS/BLUE SHIELD | Admitting: Internal Medicine

## 2016-07-23 ENCOUNTER — Ambulatory Visit: Payer: BLUE CROSS/BLUE SHIELD | Admitting: Internal Medicine

## 2016-08-08 ENCOUNTER — Encounter: Payer: Self-pay | Admitting: Internal Medicine

## 2016-08-08 ENCOUNTER — Ambulatory Visit: Payer: BLUE CROSS/BLUE SHIELD | Attending: Internal Medicine | Admitting: Internal Medicine

## 2016-08-08 VITALS — BP 133/95 | HR 64 | Temp 98.6°F | Resp 16 | Wt 163.6 lb

## 2016-08-08 DIAGNOSIS — N83201 Unspecified ovarian cyst, right side: Secondary | ICD-10-CM | POA: Insufficient documentation

## 2016-08-08 DIAGNOSIS — Z1329 Encounter for screening for other suspected endocrine disorder: Secondary | ICD-10-CM | POA: Diagnosis not present

## 2016-08-08 DIAGNOSIS — E079 Disorder of thyroid, unspecified: Secondary | ICD-10-CM | POA: Diagnosis not present

## 2016-08-08 DIAGNOSIS — Z13220 Encounter for screening for lipoid disorders: Secondary | ICD-10-CM | POA: Diagnosis not present

## 2016-08-08 DIAGNOSIS — Z23 Encounter for immunization: Secondary | ICD-10-CM | POA: Diagnosis not present

## 2016-08-08 DIAGNOSIS — I10 Essential (primary) hypertension: Secondary | ICD-10-CM | POA: Diagnosis not present

## 2016-08-08 LAB — LIPID PANEL
Cholesterol: 171 mg/dL (ref <200)
HDL: 40 mg/dL — AB (ref >50)
LDL Cholesterol: 117 mg/dL — ABNORMAL HIGH
Total CHOL/HDL Ratio: 4.3 Ratio (ref <5.0)
Triglycerides: 71 mg/dL (ref <150)
VLDL: 14 mg/dL (ref <30)

## 2016-08-08 LAB — TSH: TSH: 0.88 m[IU]/L

## 2016-08-08 MED ORDER — AMLODIPINE BESYLATE 5 MG PO TABS: 5.0000 mg | ORAL_TABLET | Freq: Every day | ORAL | 3 refills | Status: DC

## 2016-08-08 MED ORDER — LISINOPRIL-HYDROCHLOROTHIAZIDE 20-25 MG PO TABS: 1.0000 | ORAL_TABLET | Freq: Every day | ORAL | 3 refills | Status: DC

## 2016-08-08 MED ORDER — SACCHAROMYCES BOULARDII 250 MG PO CAPS: 250.0000 mg | ORAL_CAPSULE | Freq: Two times a day (BID) | ORAL | 2 refills | Status: DC

## 2016-08-08 MED FILL — AMLODIPINE BESYLATE 5 MG TA: 5 | 90 days supply | Qty: 90 | Fill #0

## 2016-08-08 NOTE — Progress Notes (Signed)
Pt is in the office today for hypertension Pt states she is not in any pain

## 2016-08-08 NOTE — Progress Notes (Signed)
Debbie Stone, is a 45 y.o. female  [id number]  [id number]  DOB - 12-23-1970  Chief Complaint  Patient pAbel Prestoresents with  . Hypertension        Subjective:   Debbie Stone is a 45 y.o. female here today for a follow up visit for htn and intermittent low back pain. She is doing well, has waiste belt, but may be using it too tight since hurts her more. Pain relieved w/ Advil  Better diet w/ salt restriction. No other c/o. Asked about her ovarian cyst and recent BV vag infection. Completed full abx course.  Here w/ her husband.   Patient has No headache, No chest pain, No abdominal pain - No Nausea, No new weakness tingling or numbness, No Cough - SOB.   Interpreter was used to communicate directly with patient for the entire encounter including providing detailed patient instructions.   No problems updated.  ALLERGIES: No Known Allergies  PAST MEDICAL HISTORY: Past Medical History:  Diagnosis Date  . Hypertension   . Renal disorder    hx of kidney stones    MEDICATIONS AT HOME: Prior to Admission medications   Medication Sig Start Date End Date Taking? Authorizing Provider  lisinopril-hydrochlorothiazide (PRINZIDE,ZESTORETIC) 20-25 MG tablet Take 1 tablet by mouth daily. 08/08/16  Yes Pete Glatterawn T Toshi Ishii, MD  amLODipine (NORVASC) 5 MG tablet Take 1 tablet (5 mg total) by mouth daily. 08/08/16   Pete Glatterawn T Juliett Eastburn, MD  Capsaicin-Menthol-Methyl Sal (CAPSAICIN-METHYL SAL-MENTHOL) 0.025-1-12 % CREA Apply 1 application topically 4 (four) times daily as needed. 03/26/16   Pete Glatterawn T Whyatt Klinger, MD  cyclobenzaprine (FLEXERIL) 5 MG tablet Take 1 tablet (5 mg total) by mouth 3 (three) times daily as needed for muscle spasms. Patient not taking: Reported on 08/08/2016 03/19/16   Pete Glatterawn T Aunesti Pellegrino, MD  metroNIDAZOLE (FLAGYL) 500 MG tablet Take 1 tablet (500 mg total) by mouth 2 (two) times daily. Patient not taking: Reported on 08/08/2016 05/25/16   Pete Glatterawn T  Rasean Joos, MD  traMADol (ULTRAM) 50 MG tablet Take 1 tablet (50 mg total) by mouth every 8 (eight) hours as needed. Patient not taking: Reported on 08/08/2016 03/19/16   Pete Glatterawn T Gayatri Teasdale, MD     Objective:   Vitals:   08/08/16 0908  BP: (!) 133/95  Pulse: 64  Resp: 16  Temp: 98.6 F (37 C)  TempSrc: Oral  SpO2: 100%  Weight: 163 lb 9.6 oz (74.2 kg)    Exam General appearance : Awake, alert, not in any distress. Speech Clear. Not toxic looking, obese, good spirits HEENT: Atraumatic and Normocephalic, pupils equally reactive to light. Neck: supple, no JVD.  Chest:Good air entry bilaterally, no added sounds. CVS: S1 S2 regular, no murmurs/gallups or rubs. Abdomen: Bowel sounds active, obese, Non tender and not distended with no gaurding, rigidity or rebound. Extremities: B/L Lower Ext shows no edema, both legs are warm to touch Neurology: Awake alert, and oriented X 3, CN II-XII grossly intact, Non focal Skin:No Rash  Data Review Lab Results  Component Value Date   HGBA1C 5.3 12/07/2015    Dep[facility]9 08/08/2016 05/23/2016 12/07/2015  Decreased Interest 0 0 0  Down, Depressed, Hopeless 0 0 0  PHQ - 2 Score 0 0 0   04/06/16 vag us IMPRESSION: 3.5 cm simple appearing right ovarian cyst, not significantly changed in size from prior CT dated December 14, 2015. If the patient is premenopausal, this is almost certainly benign, and no specific imaging follow up  is recommended according to the Society of Radiologists in Ultrasound2010 Consensus Conference Statement (D Lavonda JumboLevine et al. Management of Asymptomatic Ovarian and Other Adnexal Cysts Imaged at US: Society of Radiologists in Ultrasound Consensus Conference Statement 2010. Radiology 256 (Sept 2010): 943-954.).  Normal appearance of the uterus and left ovary.   Assessment & Plan   1. HTN (hypertension), benign Much better controlled, same prinzide 20-25 qd Add norvasc 5 day Increase exercise, decrease salt  diet discussed.  2. Lipid screening - Lipid Panel - fasting  3. Thyroid disorder screen - TSH  4. Encounter for immunization - Flu Vaccine QUAD 36+ mos IM  5. Hx of bv, treated - dw probiotics  6. Hx of right ovarian cyst, stable on us, benign - no pain, no specific f/u recd unless develops pain/etc.   Patient have been counseled extensively about nutrition and exercise  Return in about 3 months (around 11/08/2016).  The patient was given clear instructions to go to ER or return to medical center if symptoms don't improve, worsen or new problems develop. The patient verbalized understanding. The patient was told to call to get lab results if they haven't heard anything in the next week.   This note has been created with Education officer, environmentalDragon speech recognition software and smart phrase technology. Any transcriptional errors are unintentional.   Pete Glatterawn T Anastazja Isaac, MD, MBA/MHA [facility] [place], [place] [phone]   08/08/2016, 10:05 AM

## 2016-08-08 NOTE — Patient Instructions (Addendum)
Plan de alimentacin con bajo contenido de sodio (Low-Sodium Eating Plan) El sodio aumenta la presin arterial y hace que el cuerpo retenga lquidos. El consumo de alimentos con menos sodio ayuda a Armed forces technical officer presin arterial, a Forensic psychologist y a Tour manager, el hgado y los riones. Agregar sal (cloruro de sodio) a los alimentos aumenta el aporte de [place]. La mayor parte del sodio proviene de los alimentos enlatados, envasados y congelados. La pizza, la comida rpida y la comida de los restaurantes tambin contienen mucho sodio. Aunque usted tome medicamentos para bajar la presin arterial o reducir el lquido del cuerpo, es importante que disminuya el aporte de sodio de los alimentos. EN QU CONSISTE EL PLAN? La [employer] de las personas deberan limitar la ingesta de sodio a 2347m por dTraining and development officer El mdico le recomienda que limite su consumo de sodio a 2,000 mg por da.  QU DEBO SABER ACERCA DE ESTE PLAN DE [place] Para el plan de alimentacin con bajo contenido de sodio, debe seguir estas pautas generales:  Elija alimentos con un valor porcentual diario de sodio de menos del 5% (segn se indica en la etiqueta).  Use hierbas o aderezos sin sal, en lugar de sal de mesa o sal marina.  Consulte al mdico o farmacutico antes de usar sustitutos de la sal.  Coma alimentos frescos.  Coma ms frutas y verduras.  Limite las verduras enlatadas. Si las consume, enjuguelas bien para disminuir el sodio.  Limite el consumo de queso a 1onza (28g) por dTraining and development officer  Coma productos con bajo contenido de sodio, cuya etiqueta suele decir "bajo contenido de sodio" o "sin agregado de sal".  Evite alimentos que contengan glutamato monosdico (MSG), que a veces se agrega a la comida cThailandy a algunos alimentos enlatados.  Consulte las etiquetas de los alimentos (etiquetas de informacin nutricional) para saber cunto sodio contiene una porcin.  Consuma ms comida casera y menos de  restaurante, de buf y comida rpida.  Cuando coma en un restaurante, pida que preparen su comida con menos sal o, en lo posible, sin nada de sal. CMO LEO LA INFORMACIN SOBRE EL SODIO EN LAS ETIQUETAS DE LOS ALIMENTOS? La etiqueta de informacin nutricional indica la cantidad de sodio en una porcin de alimento. Si come ms de una porcin, debe multiplicar la cantidad indicada de sodio por la cantidad de porciones. Las etiquetas de los alimentos tambin pueden indicar lo siguiente:  Sin sodio: menos de 526mpor porcin.  Cantidad muy baja de sodio: 3561m menos por porcin.  Cantidad baja de sodio: 140m47mmenos por porcin.  Menor cantidad de sodio: 50% menos de sodio en una porcin. Por ejemplo, si un alimento generalmente contiene 300 mg de sodio se modifica para ser consNVR Incndr 150 mg de sodio.  Sodio reducido: 25% menos de sodiAgricultural consultantr ejemplo, si un alimento que por lo general contiene 400mg33msodio se modifica para convertirse en un alimento de sodio reducido, tendr 300mg 76modio. QU ALIMENTOS PUEDO COMER? Cereales Cereales con bajo contenido de sodio, como avena,Molinez y trigo infladAntigoigo triturado. Galletas con bajo contenido de sodio.[place]z y pastas sin sal. Pan con bajo contenido de sodio.Cashduras Verduras frescas o congeladas. Verduras enlatadas con bajo contenido de sodio o reducido de sodio. Pasta y salsa de tomate con contenido bajo o reduciLexingtons de tomate y verduras con contenido bajo o reducido de sodio.  FrutasLambert Modys frescas,  congeladas y enlatadas. Jugo de frutas.  Carnes y otros productos con protenas Atn y salmn enlatado con bajo contenido de [place]. Carne de vaca o ave, pescado y frutos de mar frescos o congelados. Cordero. Frutos secos sin sal. Lentejas, frijoles y guisantes secos, sin sal agregada. Frijoles enlatados sin sal. Sopas caseras sin sal. Huevos.  Lcteos Leche. Leche de soja. Queso  ricota. Quesos con contenido bajo o reducido de sodio. Yogur.  Condimentos Hierbas y especias frescas y secas. Aderezos sin sal. [place]olla y ajo en polvo. Variedades de [place] y ketchup con bajo contenido de sodio. Rbano picante fresco o refrigerado. Jugo de limn.  Grasas y aceites Aderezos para ensalada con contenido reducido de [place]. Mantequilla sin sal.  Otros Palomitas de maz y pretzels sin sal.  Los artculos mencionados arriba pueden no ser [employer] de las bebidas o los alimentos recomendados. Comunquese con el nutricionista para conocer ms opciones. QU ALIMENTOS NO SE RECOMIENDAN? Cereales  Cereales instantneos para comer caliente. Mezclas para bizcochos, panqueques y rellenos de pan. Crutones. Mezclas para pastas o arroz con condimento. Envases comerciales de sopa de fideos. Macarrones con queso envasados o congelados. Harina leudante. Galletas saladas comunes. Verduras Verduras enlatadas comunes. Pasta y salsa de tomate en lata comunes. Jugos comunes de tomate y de verduras. Verduras Surveyor, minerals. Papas fritas saladas. Aceitunas. Pepinillos. Salsas. Chucrut. Salsa. Carnes y otros productos con protenas Carne de vaca, pescado o frutos de mar que est salada, [place], [place], condimentada con especias o con pickles. Panceta, jamn, salchichas, perros calientes, carne curada, carne picada (carne envasada de buey) y embutidos. Cerdo salado. Cecina o charqui. Arenque en escabeche. Anchoas, atn enlatado comn y sardinas. Frutos secos con sal. Ines Bloomer para untar y quesos procesados. Requesn. Queso azul y cottage. Suero de [place].  Condimentos Sal de cebolla y ajo, sal condimentada, sal de mesa y sal marina. Salsas en lata y envasadas. Salsa [place]. Salsa trtara. Salsa barbacoa. Salsa teriyaki. Salsa de soja, incluso la que tiene contenido reducido de [place]. Salsa de carne. Salsa de pescado. Salsa de [place]. Salsa rosada. Rbano picante envasado.  Ketchup y mostaza comunes. Saborizantes y tiernizantes para carne. Caldo en cubitos. Salsa picante. Salsa tabasco. Adobos. Aderezos para tacos. Salsas. Grasas y aceites Aderezos comunes para ensalada. [place] con sal. Margarina. Mantequilla clarificada. Grasa de panceta.  Otros Nachos y papas fritas envasadas. Maz inflado y frituras de maz. Palomitas de maz y pretzels con sal. Sopas enlatadas o en polvo. Pizza. Pasteles y entradas congeladas.  Los artculos mencionados arriba pueden no ser [employer] Company de las bebidas y los alimentos que se Higher education careers adviser. Comunquese con el nutricionista para obtener ms informacin.   Esta informacin no tiene Marine scientist el consejo del mdico. Asegrese de hacerle al mdico cualquier pregunta que tenga.   Document Released: 09/17/2005 Document Revised: 10/08/2014 Elsevier Interactive Patient Education 2016 [place] (Probiotics) QU SON LOS PROBITICOS? Los probiticos son bacterias y hongos beneficiosos que viven en el [place], y cuidan su salud y la salud del aparato digestivo. Adems, ayudan al sistema de defensa del organismo (inmunitario) y protegen al cuerpo contra la proliferacin de las bacterias nocivas.  Ciertos alimentos, como el yogur, contienen probiticos. Los probiticos tambin pueden comprarse como un complemento. Al igual que sucede con cualquier complemento o frmaco, es importante hablar sobre su uso con el mdico.  QU COSAS AFECTAN EL EQUILIBRIO DE LAS BACTERIAS EN MI CUERPO? El equilibrio de las bacterias en el cuerpo puede  verse afectado por:   Antibiticos. A veces, los antibiticos son necesarios para tratar las infecciones. Lamentablemente, pueden matar, no solo a las bacterias que son nocivas para el organismo sino tambin a aquellas que son beneficiosas o tiles. Esto puede derivar en problemas gstricos, como diarrea, gases y clicos intestinales.  Enfermedades. Algunas enfermedades son  el resultado de la proliferacin excesiva de las bacterias nocivas, los hongos o los parsitos. Estos problemas incluyen los siguientes:  Diarrea infecciosa.  Infecciones estomacales y respiratorias.  Infecciones de la piel.  Sndrome del intestino irritable (SII).  Enfermedades inflamatorias del intestino.  lcera debido a infeccin por Helicobacter pylori (H. pylori).  Caries y enfermedad periodontal.  Infecciones vaginales. El estrs y la alimentacin deficiente tambin pueden disminuir las bacterias beneficiosas del organismo.  [place] ES ADECUADO PARA M? Los probiticos estn disponibles en farmacias, tiendas de alimentos naturales o tiendas de comestibles de su localidad, y no se requiere Statistician. Vienen en muchas presentaciones, combinaciones de cepas y concentraciones de dosis diferentes. Es posible que algunos deban refrigerarse. Lea siempre la etiqueta para conocer las instrucciones de uso y Barista. Hay cepas especficas que han demostrado ser ms eficaces para determinadas enfermedades. Pregntele al mdico cul es la opcin ms adecuada para usted.  POR QU NECESITARA LOS PROBITICOS? Hay muchas razones [employer] mdico puede recomendarle un complemento de probiticos, entre ellas:   Diarrea.  Estreimiento.  Sndrome del intestino irritable (SII).  Infecciones respiratorias.  Infecciones por hongos.  Acn, eczema y otras enfermedades de la piel.  Infecciones urinarias frecuentes. LOS PROBITICOS, CAUSAN EFECTOS SECUNDARIOS? Algunas personas tienen efectos secundarios leves cuando toman probiticos. Generalmente, estos efectos son temporales y pueden incluir:   Gases.  Meteorismo.  Clicos. En contadas ocasiones, pueden presentarse efectos secundarios graves, como infecciones o cambios a nivel del sistema inmunitario. QU MAS [place] DE LOS PROBITICOS?   Hay muchas cepas diferentes de probiticos. Algunas de  ellas pueden ser ms eficaces dependiendo de la enfermedad. Los probiticos estn disponibles en diversas dosis. Consulte al mdico qu probitico debe consumir y con Armed forces operational officer.  Si toma probiticos junto con antibiticos, por lo general se recomienda esperar por lo menos 2horas entre la toma del antibitico y la ingesta del probitico. Personal assistant MS INFORMACIN:  [place] Complementaria y Doctor, general practice [facility]), [email or web address]   Esta informacin no tiene Marine scientist el consejo del mdico. Asegrese de hacerle al mdico cualquier pregunta que tenga.   Document Released: 04/14/2014 Elsevier Interactive Patient Education 2016 [place] (Health Maintenance, Female) Un estilo de vida saludable y los cuidados preventivos pueden favorecer considerablemente a la salud y Musician. Pregunte a su mdico cul es el cronograma de exmenes peridicos apropiado para usted. Esta es una buena oportunidad para consultarlo sobre cmo prevenir enfermedades y Stephen sano. Adems de los controles, hay muchas otras cosas que puede hacer usted mismo. Los expertos han realizado numerosas investigaciones [employer]vinMeritor cambios en el estilo de vida y las medidas de prevencin que, [place], lo ayudarn a mantenerse sano. Solicite a su mdico ms informacin. EL PESO Y LA DIETA  Consuma una dieta saludable.  Asegrese de [employer] verduras, frutas, productos lcteos de bajo contenido de [place] y Advertising account planner.  No consuma muchos alimentos de alto contenido de grasas slidas, azcares agregados o sal.  Realice actividad fsica con regularidad. Esta es una de las prcticas  ms importantes que puede hacer por su salud.  La mayora de los adultos deben hacer ejercicio durante al menos 152mnutos por semana. El ejercicio debe aumentar la frecuencia cardaca y pActor la transpiracin (ejercicio de iRoscoe.  La mayora de los adultos tambin deben hField seismologistejercicios de elongacin al mToysRusveces a la semana. Agregue esto al su plan de ejercicio de intensidad moderada. Mantenga un peso saludable.  El ndice de masa corporal ([facility] es una medida que puede utilizarse para identificar posibles problemas de pDormont Proporciona una estimacin de la grasa corporal basndose en el peso y la altura. Su mdico puede ayudarle a dRadiation protection practitionerIGagey a lScientist, forensico mTheatre managerun peso saludable.  Para las mujeres de 20aos o ms:  Un IPutnam County Hospitalmenor de 18,5 se considera bajo peso.  Un ILiberty Ambulatory Surgery Center LLCentre 18,5 y 24,9 es normal.  Un ISumner Community Hospitalentre 25 y 29,9 se considera sobrepeso.  Un IMC de 30 o ms se considera obesidad. Observe los niveles de colesterol y lpidos en la sangre.  Debe comenzar a rEnglish as a second language teacherde lpidos y cResearch officer, trade unionen la sangre a los 20aos y luego repetirlos cada 571aos  Es posible que nAutomotive engineerlos niveles de colesterol con mayor frecuencia si:  Sus niveles de lpidos y colesterol son altos.  Es mayor de 50aos.  Presenta un alto riesgo de padecer enfermedades cardacas. DETECCIN DE CNCER  Cncer de pulmn  Se recomienda realizar exmenes de deteccin de cncer de pulmn a personas adultas entre 579y 8100aos que estn en riesgo de dHorticulturist, commercialde pulmn por sus antecedentes de consumo de tabaco.  Se recomienda una tomografa computarizada de baja dosis de los pLiberty Mediaaos a las personas que:  Fuman actualmente.  Hayan dejado el hbito en algn momento en los ltimos 15aos.  Hayan fumado durante 30aos un paquete diario. Un paquete-ao equivale a fumar un promedio de un paquete de cigarrillos diario durante un ao.  Los exmenes de deteccin anuales deben continuar hasta que hayan pasado 15aos desde que dej de fumar.  Ya no debern realizarse si tiene un problema de salud que le impida recibir tratamiento  para eScience writerde pulmn. Cncer de mama  Practique la autoconciencia de la mama. Esto significa reconocer la apariencia normal de sus mamas y cmo las siente.  Tambin significa realizar autoexmenes regulares de lJohnson & Johnson Informe a su mdico sobre cualquier cambio, sin importar cun pequeo sea.  Si tiene entre 20 y 374aos, un mdico debe realizarle un examen clnico de las mamas como parte del examen regular de sHoly Cross cada 1 a 3aos.  Si tiene 40aos o ms, debe rInformation systems managerclnico de las mMicrosoft Tambin considere realizarse una rGaston(mShelby todos los aSachse  Si tiene antecedentes familiares de cncer de mama, hable con su mdico para someterse a un estudio gentico.  Si tiene alto riesgo de pChief Financial Officerde mama, hable con su mdico para someterse a uPublic house managery u3M Company  La evaluacin del gen del cncer de mama (BRCA) se recomienda a mujeres que tengan familiares con cnceres relacionados con el BRCA. Los cnceres relacionados con el BRCA incluyen los siguientes:  Mama.  Ovario.  Trompas.  Cnceres de peritoneo.  Los resultados de la evaluacin determinarn la necesidad de asesoramiento gentico y de aTrumannde BRCA1 y BRCA2. Cncer de cuello del tero El mdico puede recomendarle que se haga pruebas peridicas de deteccin  de cncer de los rganos de la pelvis (ovarios, tero y vagina). Estas pruebas incluyen un examen plvico, que abarca controlar si se produjeron cambios microscpicos en la superficie del cuello del tero (prueba de Papanicolaou). Pueden recomendarle que se haga estas pruebas cada 3aos, a partir de los 21aos.  A las mujeres que tienen entre 30 y 56aos, los mdicos pueden recomendarles que se sometan a exmenes plvicos y pruebas de Papanicolaou cada 53aos, o a la prueba de Papanicolaou y el examen plvico en combinacin con estudios de deteccin del virus del papiloma  humano (VPH) cada 5aos. Algunos tipos de VPH aumentan el riesgo de Chief Financial Officer de cuello del tero. La prueba para la deteccin del VPH tambin puede realizarse a mujeres de cualquier edad cuyos resultados de la prueba de Papanicolaou no sean claros.  Es posible que otros mdicos no recomienden exmenes de deteccin a mujeres no embarazadas que se consideran sujetos de bajo riesgo de Chief Financial Officer de pelvis y que no tienen sntomas. Pregntele al mdico si un examen plvico de deteccin es adecuado para usted.  Si ha recibido un tratamiento para Science writer cervical o una enfermedad que podra causar cncer, necesitar realizarse una prueba de Papanicolaou y controles durante al menos 31 aos de concluido el [place]. Si no se ha hecho el Papanicolaou con regularidad, debern volver a evaluarse los factores de riesgo (como tener un nuevo compaero sexual), para Teacher, adult education si debe realizarse los estudios nuevamente. Algunas mujeres sufren problemas mdicos que aumentan la probabilidad de Museum/gallery curator cncer de cuello del tero. En estos casos, el mdico podr QUAL[employer] se realicen controles y pruebas de Papanicolaou con ms frecuencia. Cncer colorrectal  Este tipo de cncer puede detectarse y a menudo prevenirse.  Por lo general, los estudios de rutina se deben Medical laboratory scientific officer a Field seismologist a Proofreader de los 11 aos y [place] 64 aos.  Sin embargo, el mdico podr aconsejarle que lo haga antes, si tiene factores de riesgo para el cncer de colon.  Tambin puede recomendarle que use un kit de prueba para Hydrologist en la materia fecal.  Es posible que se use una pequea cmara en el extremo de un tubo para examinar directamente el colon (sigmoidoscopia o colonoscopia) a fin de Hydrographic surveyor formas tempranas de cncer colorrectal.  Los exmenes de rutina generalmente comienzan a los 70aos.  El examen directo del colon se debe repetir cada 5 a 10aos hasta los 75aos. Sin embargo, es posible que se  realicen exmenes con mayor frecuencia, si se detectan formas tempranas de plipos precancerosos o pequeos bultos. Cncer de piel  Revise la piel de la cabeza a los pies con regularidad.  Informe a su mdico si aparecen nuevos lunares o los que tiene se modifican, especialmente en su forma y color.  Tambin notifique al mdico si tiene un lunar que es ms grande que el tamao de una goma de lpiz.  Siempre use pantalla solar. Aplique pantalla solar de [place] y repetida a lo largo del Training and development officer.  Protjase usando mangas y [employer], un sombrero de ala ancha y gafas para el sol, siempre que se encuentre en el exterior. ENFERMEDADES CARDACAS, DIABETES E HIPERTENSIN ARTERIAL   La hipertensin arterial causa enfermedades cardacas y [place] el riesgo de ictus. La hipertensin arterial es ms probable en los siguientes casos:  Las personas que tienen la presin arterial en el extremo del rango normal (100-139/85-89 mm Hg).  Las personas con sobrepeso u obesidad.  Las Retail banker.  Si  usted tiene entre 18 y 39 aos, debe medirse la presin arterial cada 3 a 5 aos. Si usted tiene 40 aos o ms, debe medirse la presin arterial [employer]. Debe medirse la presin arterial dos veces: una vez cuando est en un hospital o una clnica y la otra vez cuando est en otro sitio. Registre el promedio de [employer]. Para controlar su presin arterial cuando no est en un hospital o [place], puede usar lo siguiente:  Ardelia Mems mquina automtica para medir la presin arterial en una farmacia.  Un monitor para medir la presin arterial en el hogar.  Si tiene entre 23 y 42 aos, consulte a su mdico si debe tomar aspirina para prevenir el ictus.  Realcese exmenes de deteccin de la diabetes con regularidad. Esto incluye la toma de [place] de sangre para controlar el nivel de azcar en la sangre durante el [place].  Si tiene un peso normal y un bajo riesgo de padecer  diabetes, realcese este anlisis cada tres aos despus de los 45aos.  Si tiene sobrepeso y un alto riesgo de padecer diabetes, considere someterse a este anlisis antes o con mayor frecuencia. PREVENCIN DE INFECCIONES  HepatitisB  Si tiene un riesgo ms alto de Museum/gallery curator hepatitis B, debe someterse a un examen de deteccin de este virus. Se considera que tiene un alto riesgo de Museum/gallery curator hepatitis B si:  Naci en un pas donde la hepatitis B es frecuente. Pregntele a su mdico qu pases son considerados de Public affairs consultant.  Sus padres nacieron en un pas de alto riesgo y usted no recibi una vacuna que lo proteja contra la hepatitis B (vacuna contra la hepatitis B).  [place].  [place] agujas para inyectarse drogas.  Vive con alguien que tiene hepatitis B.  Ha tenido sexo con alguien que tiene hepatitis B.  Recibe tratamiento de hemodilisis.  Toma ciertos medicamentos para el cncer, trasplante de rganos y afecciones autoinmunitarias. Hepatitis C  Se recomienda un anlisis de [place] para:  Todos los que nacieron entre 1945 y [phone].  Todas las personas que tengan un riesgo de haber contrado hepatitis C. Enfermedades de transmisin sexual (ETS).  Debe realizarse pruebas de deteccin de enfermedades de transmisin sexual (ETS), incluidas gonorrea y clamidia si:  Es sexualmente activo y es menor de 24aos.  Es mayor de 24aos, y Investment banker, operational informa que corre riesgo de tener este tipo de infecciones.  La actividad sexual ha cambiado desde que le hicieron la ltima prueba de deteccin y tiene un riesgo mayor de Best boy clamidia o Radio broadcast assistant. Pregntele al mdico si usted tiene riesgo.  Si no tiene el VIH, pero corre riesgo de infectarse por el virus, se recomienda tomar diariamente un medicamento recetado para evitar la infeccin. Esto se conoce como profilaxis previa a la exposicin. Se considera que est en riesgo si:  Es [place] sexualmente y no [place] preservativos  habitualmente o no conoce el estado del VIH de sus Advertising copywriter.  Se inyecta drogas.  Es [place] sexualmente con Ardelia Mems pareja que tiene VIH. Consulte a su mdico para saber si tiene un alto riesgo de infectarse por el VIH. Si opta por comenzar la profilaxis previa a la exposicin, primero debe realizarse anlisis de deteccin del VIH. Luego, le harn anlisis cada 7mses mientras est tomando los medicamentos para la profilaxis previa a la exposicin.  [facility]Wat[facility]  Si es premenopusica y puede quedar eEmmet solicite a su mdico asesoramiento previo a la concepcin.  Si puede quedar embarazada, tome 400 a [id number] (mcg) de cido [employer].  Si desea evitar el embarazo, hable con su mdico sobre el control de la natalidad (anticoncepcin). OSTEOPOROSIS Y MENOPAUSIA   La osteoporosis es una enfermedad en la que los huesos pierden los minerales y la fuerza por el avance de la edad. El resultado pueden ser fracturas graves en los [place]. El riesgo de osteoporosis puede identificarse con Ardelia Mems prueba de densidad sea.  Si tiene 65aos o ms, o si est en riesgo de sufrir osteoporosis y fracturas, pregunte a su mdico si debe someterse a exmenes.  Consulte a su mdico si debe tomar un suplemento de calcio o de vitamina D para reducir el riesgo de osteoporosis.  La menopausia puede presentar ciertos sntomas fsicos y [place].  La terapia de reemplazo hormonal puede reducir algunos de estos sntomas y Gaffer. Consulte a su mdico para saber si la terapia de reemplazo hormonal es conveniente para usted.  INSTRUCCIONES PARA EL CUIDADO EN EL HOGAR   Realcese los estudios de rutina de la salud, dentales y de Public librarian.  [place].  No consuma ningn producto que contenga tabaco, lo que incluye cigarrillos, tabaco de Higher education careers adviser o Psychologist, sport and exercise.  Si est embarazada, no beba alcohol.  Si est amamantando, reduzca el consumo de alcohol y la  frecuencia con la que consume.  Si es mujer y no est embarazada limite el consumo de alcohol a no ms de 1 medida por da. Una medida equivale a 12onzas de cerveza, 5onzas de vino o 1onzas de bebidas alcohlicas de alta graduacin.  No consuma drogas.  No comparta agujas.  Solicite ayuda a su mdico si necesita apoyo o informacin para abandonar las drogas.  Informe a su mdico si a menudo se siente deprimido.  Notifique a su mdico si alguna vez ha sido vctima de abuso o si no se siente seguro en su hogar.   Esta informacin no tiene Marine scientist el consejo del mdico. Asegrese de hacerle al mdico cualquier pregunta que tenga.   Document Released: 09/06/2011 Document Revised: 10/08/2014 Elsevier Interactive Patient Education 2016 [place] Tdap (contra la difteria, el ttanos y [place]): Lo que debe saber (Tdap Vaccine [Tetanus, Diphtheria, and Pertussis]: What You Need to Know) 1. Por qu vacunarse? El ttanos, la difteria y la tosferina son enfermedades muy graves. La vacuna Tdap nos puede proteger de estas enfermedades. Adems, la vacuna Tdap que se aplica a las Chemical engineer a los bebs recin nacidos contra la tosferina. En la actualidad, el [place] (trismo) es una enfermedad poco frecuente en los [place]. Provoca la contraccin y el endurecimiento dolorosos de los msculos, por lo general, de todo el cuerpo.  Puede causar el endurecimiento de los msculos de la cabeza y el cuello, de modo que impide abrir la boca, tragar y en algunos casos, Ambulance person. El ttanos causa la muerte de aproximadamente 1de cada 10personas que contraen la infeccin, incluso despus de que reciben la mejor atencin mdica. La DIFTERIA tambin es poco frecuente en los [place] [employer]. Puede causar la formacin de una membrana gruesa en la parte posterior de la garganta.  Esto tiene como consecuencia problemas respiratorios, insuficiencia  cardaca, parlisis y Ida. La TOSFERINA (tos convulsa) provoca episodios de tos intensa que pueden dificultar la respiracin y provocar vmitos y trastornos del sueo.  Tambin puede causar prdida de peso, incontinencia y fractura de [place]. Dos de cada  100 adolescentes y 5 de cada 100 adultos con tosferina deben ser hospitalizados o tienen complicaciones, que podran incluir neumona y [place]. Estas enfermedades son provocadas por bacterias. La difteria y la tosferina se contagian de Ardelia Mems persona a otra a travs de las secreciones de la tos o el estornudo. El ttanos ingresa al organismo a travs de cortes, rasguos o heridas. Antes de las vacunas, en los [place] se informaban 200000 casos de difteria, 200000 casos de tosferina y cientos de casos de ttanos cada ao. Desde el inicio de la vacunacin, los informes de casos de ttanos y difteria han disminuido alrededor del 99%, y de tosferina, alrededor del 80%. 2. Edward Jolly Tdap La vacuna Tdap protege a adolescentes y adultos contra el ttanos, la difteria y la tosferina. Una dosis de Tdap se administra a los 29 o 12 aos. Las [employer] no recibieron la vacuna Tdap a esa edad deben recibirla tan pronto como sea posible. Es muy importante que los mdicos y todos aquellos que tengan contacto cercano con bebs menores de 62mses reciban la vacuna Tdap. Las mujeres deben recibir una dosis de Tdap en cada eMedia planner para proteger al recin nacido de la tosferina. Los nios tienen mayor riesgo de complicaciones graves y potencialmente mortales debido a la tosferina. Otra vacuna llamada Td protege contra el ttanos y la difteria, pero no contra la tosferina. Todos deben recibir una dosis de refuerzo de Td cada 10 aos. La Tdap puede aplicarse como uno de estos refuerzos si nunca antes recibi esta vacuna. Tambin se puede aplicar despus de un corte o quemadura grave para prevenir la infeccin por ttanos. El mdico o la persona que le aplique  la vacuna puede darle ms informacin al rSears Holdings Corporation La Tdap puede administrarse de manera segura simultneamente con otras vacunas. 3. Algunas personas no deben recibir la vSchering-Ploughpersona que alguna vez tuvo una reaccin alrgica potencialmente mortal a uArdelia Memsdosis previa de cualquier vacuna contra el ttanos, la difteria o la tosferina, O que tenga una alergia grave a cualquiera de los componentes de esta vacuna, no debe recibir la vacuna Tdap. Informe a la persona que le aplica la vacuna si tiene cualquier alergia grave.  Una persona que estuvo en estado de coma o sufri mltiples convulsiones en el trmino de los 7das despus de recibir una dosis de DTP o DTaP, o una dosis previa de Tdap, no debe recibir la vacuna Tdap, salvo que se haya encontrado otra causa que no fuera la vacuna. An puede recibir la Td.  Consulte con su mdico si:  tiene convulsiones u otro problema del sistema nervioso,  tuvo hinchazn o dolor intenso despus de cualquier vacuna contra la difteria o el ttanos,  alguna vez ha sufrido el sndrome de [place]  no se siente bPharmacologisten que se ha programado la vacuna. 4. Riesgos Con cualquier medicamento, incluyendo las vacunas, existe la posibilidad de que aparezcan efectos secundarios. Suelen ser leves y desaparecen por s solos. Tambin son posibles las reacciones graves, pero en raras ocasiones. La mState Farmde las personas a las que se les aplica la vacuna Tdap no tienen ningn problema. Problemas leves despus de la vacuna Tdap (No interfirieron en otras actividades)  Dolor en el lugar donde se aplic la vacuna (alrededor de 3 de cada 4 adolescentes o 2 de cada 3 adultos).  Enrojecimiento o hinchazn en el lugar donde se aplic la vacuna (1 de cada 5 personas).  Fiebre leve de al menos 100,28F (38C) (Waylan Boga  alrededor de 1 cada 25 adolescentes o 1 de cada 100 adultos).  Dolor de cabeza (alrededor de 3 o 4 de cada 10 personas).  Cansancio  (alrededor de 1 de cada 3 o 4 personas).  Nuseas, vmitos, diarrea, dolor de estmago (hasta 1 de cada 4 adolescentes o 1 de cada 10 adultos).  Escalofros, dolores articulares (alrededor de 1de cada 10personas).  Dolores corporales (alrededor de 1de cada 3 o 4personas).  Erupcin cutnea, inflamacin de los ganglios (poco frecuente). Problemas moderados despus de recibir la vacuna Tdap (Interfirieron en otras actividades, pero no requirieron atencin mdica)  Management consultant donde se aplic la vacuna (hasta 1de cada 5 o 6).  Enrojecimiento o inflamacin en el lugar donde se aplic la vacuna (hasta alrededor de 1 de cada 16adolescentes o 1 de cada 12adultos).  Fiebre de ms de 102F (38,8C) (alrededor de 1 de cada 100 adolescentes o 1 de cada 250 adultos).  Dolor de cabeza (alrededor de 1de cada 7adolescentes o 1de cada 10adultos).  Nuseas, vmitos, diarrea, dolor de estmago (hasta 1 o 3 de cada 100 personas).  Hinchazn de todo el brazo en el que se aplic la vacuna (hasta alrededor de 1de cada 500personas). Problemas graves despus de la vacuna Tdap (Impidieron Optometrist las actividades habituales; requirieron atencin mdica)  Inflamacin, dolor intenso, sangrado y enrojecimiento en el brazo en que se aplic la vacuna (poco frecuente). Problemas que podran ocurrir despus de cualquier vacuna:  Las personas a veces se desmayan despus de un procedimiento mdico, incluida la vacunacin. Si permanece sentado o recostado durante 15 minutos puede ayudar a [employer] y las lesiones causadas por las cadas. Informe al mdico si se siente mareado, tiene cambios en la visin o zumbidos en los odos.  Algunas personas sienten un dolor intenso en el hombro y tienen dificultad para mover el brazo donde se coloc la vacuna. Esto sucede con muy poca frecuencia.  Cualquier medicamento puede causar una reaccin alrgica grave. Dichas reacciones son Orlene Erm poco  frecuentes con una vacuna (se calcula que menos de 1en un milln de dosis) y se producen de unos minutos a unas horas despus de Writer. Al igual que con cualquier [employer], existe una probabilidad muy remota de que una vacuna cause una lesin grave o la [place]. Se controla permanentemente la seguridad de las vacunas. Para obtener ms informacin, visite: http://www.aguilar.org/. 5. Qu pasa si hay un problema grave? A qu signos debo estar atento?  Observe todo lo que le preocupe, como signos de una reaccin alrgica grave, fiebre muy alta o comportamiento fuera de lo normal.  Los signos de una reaccin alrgica grave pueden incluir ronchas, hinchazn de la cara y la garganta, dificultad para respirar, latidos cardacos acelerados, mareos y debilidad. Generalmente, estos comenzaran entre unos pocos minutos y algunas horas despus de la vacunacin. Qu debo hacer?  Si usted piensa que se trata de una reaccin alrgica grave o de otra emergencia que no puede esperar, llame al 911 o lleve a la persona al hospital ms cercano. Sino, llame a su mdico.  Despus, la reaccin debe informarse al Sistema de Informacin sobre Efectos Adversos de las [place] (Vaccine Adverse Event Reporting System, VAERS). Su mdico puede presentar este informe, o puede hacerlo usted mismo a travs del sitio web de VAERS, en [email or web address], o llamando al [phone]. VAERS no brinda recomendaciones mdicas. 6. [place] Compensacin de Daos por [place] de Compensacin de Daos por Clinical biochemist (National Vaccine Injury Fiserv,  VICP) es un programa federal que fue creado para compensar a las personas que puedan haber sufrido daos al recibir ciertas vacunas. Aquellas personas que consideren que han sufrido un dao como consecuencia de una vacuna y [place] saber ms acerca del programa y de cmo presentar Raechel Chute, pueden llamar al [phone] o visitar  su sitio web en [email or web address]. Hay un lmite de tiempo para presentar un reclamo de compensacin. 7. Cmo puedo obtener ms informacin?  Consulte a su mdico. Este puede darle el prospecto de la vacuna o recomendarle otras fuentes de informacin.  Comunquese con el servicio de salud de su localidad o su estado.  Comunquese con los Centros para Building surveyor y la Prevencin de Probation officer for Disease Control and Prevention , CDC).  Llame al 4090524949 (1-800-CDC-INFO), o  visite el sitio web [employer] en http://hunter.com/. Declaracin de informacin sobre la vacuna contra la difteria, el ttanos y la tosferina (Tdap) de los CDC (24/02/15)   Esta informacin no tiene Marine scientist el consejo del mdico. Asegrese de hacerle al mdico cualquier pregunta que tenga.   Document Released: 09/03/2012 Document Revised: 10/08/2014 Elsevier Interactive Patient Education 2016 [employer]. Influenza Virus Vaccine injection (Fluarix) Qu es este medicamento? La VACUNA ANTIGRIPAL ayuda a disminuir el riesgo de contraer la influenza, tambin conocida como la gripe. La vacuna solo ayuda a protegerle contra algunas cepas de influenza. Esta vacuna no ayuda a reducir Catering manager de contraer influenza pandmica H1N1. Este medicamento puede ser utilizado para otros usos; si tiene alguna pregunta consulte con su proveedor de atencin mdica o con su farmacutico. Qu le debo informar a mi profesional de la salud antes de tomar este medicamento? Necesita saber si usted presenta alguno de los siguientes problemas o situaciones: -trastorno de sangrado como hemofilia -fiebre o infeccin -sndrome de Guillain-Barre u otros problemas neurolgicos -problemas del sistema inmunolgico -infeccin por el virus de la inmunodeficiencia humana (VIH) o SIDA -niveles bajos de plaquetas en la sangre -esclerosis mltiple -una Risk analyst o inusual a las vacunas antigripales,  a los huevos, protenas de pollo, al ltex, a la gentamicina, a otros medicamentos, alimentos, colorantes o conservantes -si est embarazada o buscando quedar embarazada -si est amamantando a un beb Cmo debo utilizar este medicamento? Esta vacuna se administra mediante inyeccin por va intramuscular. Lo administra un profesional de [employer]	[place]. Recibir una copia de informacin escrita sobre la vacuna antes de cada vacuna. Asegrese de leer este folleto cada vez cuidadosamente. Este folleto puede cambiar con frecuencia. Hable con su pediatra para informarse acerca del uso de este medicamento en nios. Puede requerir atencin especial. Sobredosis: Pngase en contacto inmediatamente con un centro toxicolgico o una sala de urgencia si usted cree que haya tomado demasiado medicamento. ATENCIN: [employer] es solo para usted. No comparta este medicamento con nadie. Qu sucede si me olvido de una dosis? No se aplica en este caso. Qu puede interactuar con este medicamento? -quimioterapia o radioterapia -medicamentos que suprimen el sistema inmunolgico, tales como etanercept, anakinra, infliximab y adalimumab -medicamentos que tratan o previenen cogulos sanguneos, como warfarina -fenitona -medicamentos esteroideos, como la prednisona o la cortisona -teofilina -vacunas Puede ser que esta lista no menciona todas las posibles interacciones. Informe a su profesional de [employer]	[place] de [employer] productos a base de hierbas, medicamentos de [employer] o suplementos nutritivos que est tomando. Si usted fuma, consume bebidas alcohlicas o si utiliza drogas ilegales, indqueselo tambin a su profesional de [employer]	[place]. Algunas sustancias pueden interactuar con  su medicamento. A qu debo estar atento al usar [employer]? Informe a su mdico o a Barrister's clerk de la [employer] todos los efectos secundarios que persistan despus de 3 das. Llame a su proveedor de atencin mdica si se presentan  sntomas inusuales dentro de las 6 semanas posteriores a la vacunacin. Es posible que todava pueda contraer la gripe, pero la enfermedad no ser tan fuerte como normalmente. No puede contraer la gripe de esta vacuna. La vacuna antigripal no le protege contra resfros u otras enfermedades que pueden causar [place]. Debe vacunarse cada ao. Qu efectos secundarios puedo tener al [employer] este medicamento? Efectos secundarios que debe informar a su mdico o a Barrister's clerk de la salud tan pronto como sea posible: -reacciones alrgicas como erupcin cutnea, picazn o urticarias, hinchazn de la cara, labios o lengua Efectos secundarios que, por lo general, no requieren atencin mdica (debe informarlos a su mdico o a su profesional de la salud si persisten o si son molestos): -fiebre -dolor de cabeza -molestias y dolores musculares -dolor, sensibilidad, enrojecimiento o Estate agent de la inyeccin -cansancio o debilidad Puede ser que esta lista no menciona todos los posibles efectos secundarios. Comunquese a su mdico por asesoramiento mdico [employer]. Usted puede informar los efectos secundarios a la FDA por telfono al 1-800-FDA-1088. Dnde debo guardar mi medicina? Esta vacuna se administra solamente en clnicas, farmacias, consultorio mdico u otro consultorio de un profesional de la salud y no Sports coach en su domicilio. ATENCIN: Este folleto es un resumen. Puede ser que no cubra toda la posible informacin. Si usted tiene preguntas acerca de esta medicina, consulte con su mdico, su farmacutico o su profesional de Technical sales engineer.    2016, Elsevier/Gold Standard. (2010-03-21 15:31:40)

## 2016-08-15 ENCOUNTER — Telehealth: Payer: Self-pay

## 2016-08-15 NOTE — Telephone Encounter (Signed)
Pt was called and a VM was left informing pt to return phone call. If pt calls back send message to [place].

## 2017-01-14 ENCOUNTER — Telehealth: Payer: Self-pay | Admitting: Internal Medicine

## 2017-01-14 NOTE — Telephone Encounter (Signed)
Patient called the office to request medication refill for her bp medication. Please send it to [facility] on [address].   Thank you.

## 2017-01-15 MED ORDER — LISINOPRIL-HYDROCHLOROTHIAZIDE 20-25 MG PO TABS: 1.0000 | ORAL_TABLET | Freq: Every day | ORAL | 0 refills | Status: DC

## 2017-01-15 MED ORDER — AMLODIPINE BESYLATE 5 MG PO TABS: 5.0000 mg | ORAL_TABLET | Freq: Every day | ORAL | 0 refills | Status: DC

## 2017-01-15 NOTE — Telephone Encounter (Signed)
Blood pressure medications sent to [facility] - patient due for office visit

## 2017-02-14 ENCOUNTER — Encounter: Payer: Self-pay | Admitting: Internal Medicine

## 2017-02-15 ENCOUNTER — Encounter: Payer: Self-pay | Admitting: Internal Medicine

## 2017-02-18 ENCOUNTER — Encounter: Payer: Self-pay | Admitting: Internal Medicine

## 2017-03-06 ENCOUNTER — Other Ambulatory Visit: Payer: Self-pay | Admitting: Pharmacist

## 2017-03-06 MED ORDER — LISINOPRIL-HYDROCHLOROTHIAZIDE 20-25 MG PO TABS: 1.0000 | ORAL_TABLET | Freq: Every day | ORAL | 0 refills | Status: DC

## 2017-03-21 ENCOUNTER — Ambulatory Visit: Payer: BLUE CROSS/BLUE SHIELD | Attending: Internal Medicine | Admitting: Internal Medicine

## 2017-03-21 ENCOUNTER — Encounter: Payer: Self-pay | Admitting: Internal Medicine

## 2017-03-21 VITALS — BP 122/82 | HR 75 | Temp 98.7°F | Wt 172.0 lb

## 2017-03-21 DIAGNOSIS — M2550 Pain in unspecified joint: Secondary | ICD-10-CM

## 2017-03-21 DIAGNOSIS — N83201 Unspecified ovarian cyst, right side: Secondary | ICD-10-CM | POA: Diagnosis not present

## 2017-03-21 DIAGNOSIS — X58XXXA Exposure to other specified factors, initial encounter: Secondary | ICD-10-CM | POA: Insufficient documentation

## 2017-03-21 DIAGNOSIS — S161XXA Strain of muscle, fascia and tendon at neck level, initial encounter: Secondary | ICD-10-CM

## 2017-03-21 DIAGNOSIS — M545 Low back pain: Secondary | ICD-10-CM | POA: Insufficient documentation

## 2017-03-21 DIAGNOSIS — G8929 Other chronic pain: Secondary | ICD-10-CM | POA: Diagnosis not present

## 2017-03-21 DIAGNOSIS — I10 Essential (primary) hypertension: Secondary | ICD-10-CM | POA: Diagnosis present

## 2017-03-21 DIAGNOSIS — Z131 Encounter for screening for diabetes mellitus: Secondary | ICD-10-CM | POA: Insufficient documentation

## 2017-03-21 LAB — POCT GLYCOSYLATED HEMOGLOBIN (HGB A1C): HEMOGLOBIN A1C: 5.4

## 2017-03-21 MED ORDER — LISINOPRIL-HYDROCHLOROTHIAZIDE 20-25 MG PO TABS: 1.0000 | ORAL_TABLET | Freq: Every day | ORAL | 6 refills | Status: DC

## 2017-03-21 MED ORDER — DICLOFENAC SODIUM 1 % TD GEL: 2.0000 g | Freq: Four times a day (QID) | TRANSDERMAL | 1 refills | Status: DC

## 2017-03-21 MED ORDER — CYCLOBENZAPRINE HCL 5 MG PO TABS: 5.0000 mg | ORAL_TABLET | Freq: Every day | ORAL | 1 refills | Status: DC | PRN

## 2017-03-21 NOTE — Patient Instructions (Signed)
Ejercicios para el cuello (Neck Exercises) Los ejercicios para el cuello pueden ser importantes por muchos motivos:  Pueden ayudarlo a mejorar y Pharmacologistmantener la flexibilidad del cuello. Esto puede ser sumamente importante a medida que envejece.  Pueden ayudarlo a fortalecer el cuello. Esto facilitar los movimientos.  Pueden reducir o Psychologist, sport and exerciseevitar el dolor de cuello.  Pueden ayudar a WellPointaliviar los dolores en la parte superior de la espalda. Hable con su mdico acerca de los ejercicios para el cuello que son recomendables para usted. EJERCICIOS PARA MEJORAR LA FLEXIBILIDAD DEL CUELLO Estiramiento de cuello Repita este ejercicio de 3 a 5veces. 1. Haga este ejercicio de pie o sentado en una silla. 2. Apoye los pies en el piso, con una separacin del ancho de los hombros. 3. Gire lentamente la cabeza hacia la derecha. Grela completamente hacia la derecha de modo que pueda ver por encima de su hombro derecho. No incline ni ladee la cabeza. 4. Mantenga esta posicin durante 10 a 30segundos. 5. Gire lentamente la cabeza hacia la izquierda, hasta poder ver por encima de su hombro izquierdo. 6. Mantenga esta posicin durante 10 a 30segundos. Retraccin del cuello Repita este ejercicio de 8 a 10veces. Hgalo 3 o 4veces al da, o como se lo haya indicado el mdico. 1. Haga este ejercicio de pie o sentado en una silla firme. 2. Mire hacia adelante. No doble el cuello. 3. Use los dedos para empujar la barbilla hacia atrs. No doble el cuello al Progress Energyrealizar este movimiento. Siga mirando hacia adelante. Si hace el ejercicio correctamente, tendr Neomia Dearuna leve sensacin en la garganta y sentir que la nuca se estira. 4. Mantenga la elongacin durante 1o 2segundos. Reljese y luego repita el ejercicio. EJERCICIOS PARA MEJORAR LA FUERZA DEL CUELLO Presin con el cuello Repita este ejercicio 10veces. Hgalo apenas se despierte por la maana y justo antes de Marathonacostarse, o como se lo haya indicado el  mdico. 1. Recustese sobre su espalda en una cama firme o en el suelo, y coloque una almohada debajo de la cabeza. 2. Use los msculos del cuello para presionar la cabeza contra la almohada y enderezar la columna vertebral. 3. Mantenga la posicin lo mejor que pueda. Mantenga la cabeza mirando hacia arriba y el mentn Roselandhacia abajo. 4. Cuente lentamente hasta 5mientras mantiene esta posicin. 5. Relaje algunos segundos. Repita. Fortalecimiento isomtrico Engineer, drillingHaga una serie completa de ejercicios 2veces al da o como se lo haya indicado el mdico. 1. Sintese en una silla con buen apoyo y coloque una mano sobre la frente. 2. Empuje hacia adelante con la cabeza y el cuello mientras que con la mano ejerce cierta presin hacia el lado contrario. Mantenga esta posicin durante 10segundos. 3. Reljese. Repita el ejercicio 3veces. 4. A continuacin, vuelva a realizar la secuencia, esta vez poniendo la mano contra la nuca. Use la cabeza y el cuello para empujar en la direccin contraria a la presin que ejerce con la mano. 5. Para terminar, haga el mismo ejercicio en cada lado de la cabeza, empujando hacia los lados en la direccin contraria a la presin de Engineer, sitela mano. Levantamiento de la cabeza desde la posicin decbito prono Repita este ejercicio 5veces. Hgalo 2veces al da o como se lo haya indicado el mdico. 1. Acustese boca abajo y apoye los codos de modo que el pecho y la parte superior de la espalda se eleven. 2. Comience con la cabeza mirando hacia abajo, cerca del pecho. Coloque el mentn cerca del pecho o [place] este. 3. Eleve lentamente la cabeza.  Hgalo hasta quedar mirando hacia adelante. Contine elevando y estirando la cabeza hacia atrs lo ms que pueda. 4. Mantenga la cabeza elevada durante 5segundos. A continuacin, bjela lentamente hasta la posicin inicial. Levantamiento de la cabeza desde la posicin decbito supino Repita este ejercicio de 8 a 10veces. Hgalo 2veces al da o  como se lo haya indicado el mdico. 1. Acustese sobre su espalda, doble las rodillas apuntando hacia el techo y mantenga los pies apoyados en el piso. 2. Levante lentamente la cabeza del suelo, y eleve el mentn hacia el pecho. 3. Mantenga esta posicin durante 5segundos. 4. Reljese y luego repita el ejercicio. Retraccin escapular Repita este ejercicio 5veces. Hgalo 2veces al da o como se lo haya indicado el mdico. 1. Prese con los brazos a los costados. Mire hacia adelante. 2. Lentamente, lleve los hombros hacia atrs y hacia abajo hasta sentir que la zona de los omplatos, en la parte alta de la espalda, se estira. 3. Mantenga esta posicin durante 10 a 30segundos. 4. Reljese y luego repita el ejercicio. SOLICITE ATENCIN MDICA SI:  El dolor o las molestias en el cuello se vuelven mucho ms intensos cuando hace un ejercicio.  El dolor o las molestias en el cuello no mejoran en el trmino de las 2horas posteriores a hacer los ejercicios. Si tiene alguno de estos problemas, deje de hacer los ejercicios de inmediato. No vuelva a hacer los ejercicios a menos que el mdico lo autorice. SOLICITE ATENCIN MDICA DE INMEDIATO SI:  Siente un dolor sbito e intenso en el cuello. Si esto ocurre, deje de hacer los ejercicios de inmediato. No vuelva a hacer los ejercicios a menos que el mdico lo autorice. Esta informacin no tiene como fin reemplazar el consejo del mdico. Asegrese de hacerle al mdico cualquier pregunta que tenga. Document Released: 10/14/2015 Document Revised: 10/14/2015 Document Reviewed: 03/28/2015 Elsevier Interactive Patient Education  2017 Elsevier Inc.  

## 2017-03-21 NOTE — Progress Notes (Signed)
Pt is having a lot of pain in both knees.

## 2017-03-21 NOTE — Progress Notes (Addendum)
Patient ID: Debbie Stone, female    DOB: December 24, 1970  MRN: [id number]  CC: No chief complaint on file.   Subjective: Debbie Stone is a 46 y.o. female who presents to re-est care.  Her spouse is with her Her concerns today include:  Pt with hx of HTN and RT ovarian cyst  1. HTN: compliant with Lisinopril-HCTZ only.  Not Norvasc -check BP occasionally.  3 wks ago reading was 132/82 -no CP/LE edema or SOB  2. Some pain in RT side of neck x 3 wks.  -present with rotation of the neck -pain also in arms and legs mainly knees.   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  -no jt swelling or stiffness, no unexplained weight loss. No fevers.  Patient Active Problem List   Diagnosis Date Noted  . Diabetes mellitus screening 03/21/2017  . Chronic lower back pain 05/23/2016  . Adnexal cyst 03/19/2016  . Hypertensive urgency 12/07/2015  . Healthcare maintenance 12/07/2015  . Essential hypertension 12/07/2015     Current Outpatient Prescriptions on File Prior to Visit  Medication Sig Dispense Refill  . traMADol (ULTRAM) 50 MG tablet Take 1 tablet (50 mg total) by mouth every 8 (eight) hours as needed. 45 tablet 0   No current facility-administered medications on file prior to visit.     No Known Allergies  Social History   Social History  . Marital status: Married    Spouse name: N/A  . Number of children: N/A  . Years of education: N/A   Occupational History  . Not on file.   Social History Main Topics  . Smoking status: Never Smoker  . Smokeless tobacco: Not on file  . Alcohol use No  . Drug use: No  . Sexual activity: Yes   Other Topics Concern  . Not on file   Social History Narrative  . No narrative on file    No family history on file.  Past Surgical History:  Procedure Laterality Date  . CESAREAN SECTION    . LUNG SURGERY      ROS: Review of Systems  Constitutional: Negative  for fatigue.  Musculoskeletal: Positive for arthralgias and neck pain. Negative for joint swelling.   PHYSICAL EXAM: BP 122/82   Pulse 75   Temp 98.7 F (37.1 C) (Oral)   Wt 172 lb (78 kg)   SpO2 100%   BMI 29.99 kg/m   Wt Readings from Last 3 Encounters:  03/21/17 172 lb (78 kg)  08/08/16 163 lb 9.6 oz (74.2 kg)  05/23/16 165 lb 6.4 oz (75 kg)   Physical Exam  General appearance - alert, well appearing, and in no distress Mental status - alert, oriented to person, place, and time, normal mood, behavior, speech, dress, motor activity, and thought processes Mouth - mucous membranes moist, pharynx normal without lesions Chest - clear to auscultation, no wheezes, rales or rhonchi, symmetric air entry Heart - normal rate, regular rhythm, normal S1, S2, no murmurs, rubs, clicks or gallops Musculoskeletal - no joint tenderness, deformity or swelling. Good range of motion of the large joints including knees hips shoulders and elbows. Neck: Mild tenderness along the right side of the trapezius. Mild discomfort with passive range of motion. Extremities - no lower extremity edema   Depression screen [facility] 2/9 08/08/2016 05/23/2016 12/07/2015  Decreased Interest 0 0 0  Down, Depressed, Hopeless 0 0 0  PHQ - 2 Score  0 0 0   Results for orders placed or performed in visit on 03/21/17  POCT glycosylated hemoglobin (Hb A1C)  Result Value Ref Range   Hemoglobin A1C 5.4     ASSESSMENT AND PLAN: 1. HTN (hypertension), benign Refill lisinopril HCTZ. Encouraged to continue low-salt diet. - CBC - Comprehensive metabolic panel - lisinopril-hydrochlorothiazide (PRINZIDE,ZESTORETIC) 20-25 MG tablet; Take 1 tablet by mouth daily.  Dispense: 30 tablet; Refill: 6  2. Strain of neck muscle, initial encounter - diclofenac sodium (VOLTAREN) 1 % GEL; Apply 2 g topically 4 (four) times daily.  Dispense: 100 g; Refill: 1 - cyclobenzaprine (FLEXERIL) 5 MG tablet; Take 1 tablet (5 mg total) by mouth daily as  needed for muscle spasms.  Dispense: 30 tablet; Refill: 1 Patient warned  that Flexeril can cause drowsiness. 3. Diabetes mellitus screening  - POCT glycosylated hemoglobin (Hb A1C)  4. Arthralgia, unspecified joint - diclofenac sodium (VOLTAREN) 1 % GEL; Apply 2 g topically 4 (four) times daily.  Dispense: 100 g; Refill: 1 - cyclobenzaprine (FLEXERIL) 5 MG tablet; Take 1 tablet (5 mg total) by mouth daily as needed for muscle spasms.  Dispense: 30 tablet; Refill: 1  Patient was given the opportunity to ask questions.  Patient verbalized understanding of the plan and was able to repeat key elements of the plan.  [employer] interpreter used during this encounter.  Orders Placed This Encounter  Procedures  . CBC  . Comprehensive metabolic panel  . POCT glycosylated hemoglobin (Hb A1C)     Requested Prescriptions   Signed Prescriptions Disp Refills  . cyclobenzaprine (FLEXERIL) 5 MG tablet 30 tablet 1    Sig: Take 1 tablet (5 mg total) by mouth daily as needed for muscle spasms.  . diclofenac sodium (VOLTAREN) 1 % GEL 100 g 1    Sig: Apply 2 g topically 4 (four) times daily.  Marland Kitchen. lisinopril-hydrochlorothiazide (PRINZIDE,ZESTORETIC) 20-25 MG tablet 30 tablet 6    Sig: Take 1 tablet by mouth daily.    Return in about 6 months (around 09/20/2017).  Debbie Blueeborah Karyn Brull, MD, FACP   ADDENDUM:  Voltaren gel not a preferred med with pt's insurance.  Will d/c and change to Naprosyn PRN.

## 2017-03-22 LAB — CBC
HEMATOCRIT: 36 % (ref 34.0–46.6)
HEMOGLOBIN: 10.9 g/dL — AB (ref 11.1–15.9)
MCH: 22.3 pg — ABNORMAL LOW (ref 26.6–33.0)
MCHC: 30.3 g/dL — ABNORMAL LOW (ref 31.5–35.7)
MCV: 74 fL — ABNORMAL LOW (ref 79–97)
Platelets: 244 10*3/uL (ref 150–379)
RBC: 4.89 x10E6/uL (ref 3.77–5.28)
RDW: 15.3 % (ref 12.3–15.4)
WBC: 5.5 10*3/uL (ref 3.4–10.8)

## 2017-03-22 LAB — COMPREHENSIVE METABOLIC PANEL
ALBUMIN: 4.4 g/dL (ref 3.5–5.5)
ALT: 16 IU/L (ref 0–32)
AST: 21 IU/L (ref 0–40)
Albumin/Globulin Ratio: 1.4 (ref 1.2–2.2)
Alkaline Phosphatase: 62 IU/L (ref 39–117)
BUN / CREAT RATIO: 19 (ref 9–23)
BUN: 14 mg/dL (ref 6–24)
Bilirubin Total: <0.2 mg/dL (ref 0.0–1.2)
CALCIUM: 9 mg/dL (ref 8.7–10.2)
CO2: 24 mmol/L (ref 20–29)
CREATININE: 0.73 mg/dL (ref 0.57–1.00)
Chloride: 100 mmol/L (ref 96–106)
GFR, EST AFRICAN AMERICAN: 116 mL/min/{1.73_m2} (ref >59)
GFR, EST NON AFRICAN AMERICAN: 100 mL/min/{1.73_m2} (ref >59)
GLOBULIN, TOTAL: 3.2 g/dL (ref 1.5–4.5)
Glucose: 81 mg/dL (ref 65–99)
Potassium: 3.9 mmol/L (ref 3.5–5.2)
SODIUM: 139 mmol/L (ref 134–144)
TOTAL PROTEIN: 7.6 g/dL (ref 6.0–8.5)

## 2017-03-22 MED ORDER — NAPROXEN 500 MG PO TABS: 500.0000 mg | ORAL_TABLET | Freq: Two times a day (BID) | ORAL | 0 refills | Status: DC

## 2017-03-22 NOTE — Addendum Note (Signed)
Addended by: Jonah BlueJOHNSON, Melvin Whiteford B on: 03/22/2017 11:38 AM   Modules accepted: Orders

## 2017-03-25 ENCOUNTER — Encounter: Payer: Self-pay | Admitting: Pharmacist

## 2017-03-25 NOTE — Progress Notes (Signed)
Completed prior authorization for Voltaren Gel. Pending per [employer].

## 2017-03-28 ENCOUNTER — Encounter: Payer: Self-pay | Admitting: Pharmacist

## 2017-03-28 NOTE — Progress Notes (Signed)
Prior authorization for diclofenac gel approved

## 2017-04-04 ENCOUNTER — Telehealth: Payer: Self-pay

## 2017-04-04 NOTE — Telephone Encounter (Signed)
CMA call regarding lab results   Patient Verify DOB   Patient was aware and understood  

## 2017-04-04 NOTE — Telephone Encounter (Signed)
-----   Message from Marcine Matareborah B Johnson, MD sent at 03/22/2017 11:33 AM EDT ----- Please call pt and let her know that kidney and Liver function tests are abnormal.  She has a mild to moderate anemia.  Please return to lab at her earliest convenience to have iron studies done.  Also let her know that her insurance did not approve payment for the pain gel prescribed yesterday.  Therefore, I will change to oral anti-inflammatory pill called Naprosyn.  Prescription sent to her pharmacy.

## 2017-04-08 ENCOUNTER — Ambulatory Visit: Payer: BLUE CROSS/BLUE SHIELD | Attending: Internal Medicine

## 2017-04-08 DIAGNOSIS — D649 Anemia, unspecified: Secondary | ICD-10-CM | POA: Insufficient documentation

## 2017-04-08 DIAGNOSIS — D509 Iron deficiency anemia, unspecified: Secondary | ICD-10-CM | POA: Insufficient documentation

## 2017-04-08 NOTE — Progress Notes (Signed)
Patient here for lab visit only 

## 2017-04-09 LAB — IRON AND TIBC
Iron Saturation: 36 % (ref 15–55)
Iron: 107 ug/dL (ref 27–159)
Total Iron Binding Capacity: 296 ug/dL (ref 250–450)
UIBC: 189 ug/dL (ref 131–425)

## 2017-04-09 LAB — FERRITIN: FERRITIN: 99 ng/mL (ref 15–150)

## 2017-04-11 ENCOUNTER — Telehealth: Payer: Self-pay | Admitting: Internal Medicine

## 2017-04-11 NOTE — Telephone Encounter (Signed)
Pt was notified on 04/04/2017 by Mardene CelesteJoanna of lab results. Will forward to pcp regarding medication for pain

## 2017-04-11 NOTE — Telephone Encounter (Signed)
Message sent to CMA. Pt will need to be seen for eval of "kidney pain."

## 2017-04-11 NOTE — Telephone Encounter (Signed)
Good Morning  Dr Laural BenesJohnson spanish patient call regarding her lab results and to see if Dr Laural BenesJohnson can give her something for her pain in the kidney thank you

## 2017-04-29 ENCOUNTER — Telehealth: Payer: Self-pay | Admitting: Internal Medicine

## 2017-04-29 NOTE — Telephone Encounter (Signed)
Pt called requesting lab results that were checked 7/9, please f/up

## 2017-10-18 ENCOUNTER — Ambulatory Visit: Payer: BLUE CROSS/BLUE SHIELD | Admitting: Internal Medicine

## 2017-10-30 ENCOUNTER — Ambulatory Visit: Payer: BLUE CROSS/BLUE SHIELD | Attending: Family Medicine | Admitting: Family Medicine

## 2017-10-30 ENCOUNTER — Encounter: Payer: Self-pay | Admitting: Family Medicine

## 2017-10-30 VITALS — BP 160/105 | HR 71 | Temp 98.4°F | Wt 177.2 lb

## 2017-10-30 DIAGNOSIS — Z79899 Other long term (current) drug therapy: Secondary | ICD-10-CM | POA: Diagnosis not present

## 2017-10-30 DIAGNOSIS — M2550 Pain in unspecified joint: Secondary | ICD-10-CM | POA: Insufficient documentation

## 2017-10-30 DIAGNOSIS — Z87442 Personal history of urinary calculi: Secondary | ICD-10-CM | POA: Insufficient documentation

## 2017-10-30 DIAGNOSIS — Z9114 Patient's other noncompliance with medication regimen: Secondary | ICD-10-CM | POA: Insufficient documentation

## 2017-10-30 DIAGNOSIS — I10 Essential (primary) hypertension: Secondary | ICD-10-CM | POA: Insufficient documentation

## 2017-10-30 MED ORDER — AMLODIPINE BESYLATE 10 MG PO TABS: 10.0000 mg | ORAL_TABLET | Freq: Every day | ORAL | 3 refills | Status: DC

## 2017-10-30 MED ORDER — NAPROXEN 500 MG PO TABS: 500.0000 mg | ORAL_TABLET | Freq: Two times a day (BID) | ORAL | 3 refills | Status: DC

## 2017-10-30 NOTE — Progress Notes (Signed)
Subjective:  Patient ID: Debbie Stone, female    DOB: January 14, 1971  Age: 47 y.o. MRN: [id number]  CC: Establish Care and Temporomandibular Joint Pain   HPI Debbie Stone is a 47 year old female with a history of hypertension who presents today with complaints of joint pains in both knees, both wrists and both elbows for the last 2 months.  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 She attributes onset of pain to when she commenced taking her antihypertensive-lisinopril/hydrochlorothiazide and reports slight improvement in joint pains when she started taking half of the pill rather than the whole pill which explains her elevated blood pressure today. Symptoms have also worsened with the cold weather but she denies swelling of joints or history of trauma. She denies numbness in her hands or shooting pains and has not had a fever. 2 days ago she obtained glucosamine tablets from the pharmacy which she has been taking for her joint symptoms.  Past Medical History:  Diagnosis Date  . Hypertension   . Renal disorder    hx of kidney stones    Past Surgical History:  Procedure Laterality Date  . CESAREAN SECTION    . LUNG SURGERY      No Known Allergies   Outpatient Medications Prior to Visit  Medication Sig Dispense Refill  . lisinopril-hydrochlorothiazide (PRINZIDE,ZESTORETIC) 20-25 MG tablet Take 1 tablet by mouth daily. 30 tablet 6  . cyclobenzaprine (FLEXERIL) 5 MG tablet Take 1 tablet (5 mg total) by mouth daily as needed for muscle spasms. (Patient not taking: Reported on 10/30/2017) 30 tablet 1  . traMADol (ULTRAM) 50 MG tablet Take 1 tablet (50 mg total) by mouth every 8 (eight) hours as needed. (Patient not taking: Reported on 10/30/2017) 45 tablet 0  . naproxen (NAPROSYN) 500 MG tablet Take 1 tablet (500 mg total) by mouth 2 (two) times daily with a meal. (Patient not taking: Reported  on 10/30/2017) 30 tablet 0   No facility-administered medications prior to visit.     ROS Review of Systems  Constitutional: Negative for activity change, appetite change and fatigue.  HENT: Negative for congestion, sinus pressure and sore throat.   Eyes: Negative for visual disturbance.  Respiratory: Negative for cough, chest tightness, shortness of breath and wheezing.   Cardiovascular: Negative for chest pain and palpitations.  Gastrointestinal: Negative for abdominal distention, abdominal pain and constipation.  Endocrine: Negative for polydipsia.  Genitourinary: Negative for dysuria and frequency.  Musculoskeletal: Positive for arthralgias. Negative for back pain.  Skin: Negative for rash.  Neurological: Negative for tremors, light-headedness and numbness.  Hematological: Does not bruise/bleed easily.  Psychiatric/Behavioral: Negative for agitation and behavioral problems.    Objective:  BP (!) 160/105   Pulse 71   Temp 98.4 F (36.9 C) (Oral)   Wt 177 lb 3.2 oz (80.4 kg)   SpO2 100%   BMI 30.90 kg/m   BP/Weight 10/30/2017 03/21/2017 16/11/8451  Systolic BP 646 803 212  Diastolic BP 248 82 95  Wt. (Lbs) 177.2 172 163.6  BMI 30.9 29.99 28.53      Physical Exam  Constitutional: She is oriented to person, place, and time. She appears well-developed and well-nourished.  Cardiovascular: Normal rate, normal heart sounds and intact distal pulses.  No murmur heard. Pulmonary/Chest: Effort normal and breath sounds normal. She has no wheezes. She has no rales. She exhibits no tenderness.  Abdominal: Soft. Bowel sounds are  normal. She exhibits no distension and no mass. There is no tenderness.  Musculoskeletal: Normal range of motion. She exhibits tenderness (slight tenderness on ROM of both knees with mild crepitus). She exhibits no edema.  Negative Phalen sign bilaterally  Neurological: She is alert and oriented to person, place, and time.  Skin: Skin is warm and dry.    Psychiatric: She has a normal mood and affect.     Assessment & Plan:   1. Arthralgia, unspecified joint Likely underlying early osteoarthritis Encouraged that weight loss could bring about improvement in knee symptoms - naproxen (NAPROSYN) 500 MG tablet; Take 1 tablet (500 mg total) by mouth 2 (two) times daily with a meal.  Dispense: 60 tablet; Refill: 3  2. Essential hypertension Uncontrolled due to noncompliance with medications We will switch from lisinopril/HCTZ to amlodipine this patient is of the opinion that the former caused her arthralgias Counseled on blood pressure goal of less than 130/80, low-sodium, DASH diet, medication compliance, 150 minutes of moderate intensity exercise per week. Discussed medication compliance, adverse effects. - amLODipine (NORVASC) 10 MG tablet; Take 1 tablet (10 mg total) by mouth daily.  Dispense: 30 tablet; Refill: 3 - [id number]+EGFR   Meds ordered this encounter  Medications  . amLODipine (NORVASC) 10 MG tablet    Sig: Take 1 tablet (10 mg total) by mouth daily.    Dispense:  30 tablet    Refill:  3    Discontinue Lisinopril/hctz  . naproxen (NAPROSYN) 500 MG tablet    Sig: Take 1 tablet (500 mg total) by mouth 2 (two) times daily with a meal.    Dispense:  60 tablet    Refill:  3    Follow-up: Return in about 3 months (around 01/28/2018) for follow up of Hypertension with PCP - Dr Wynetta Emery.   Charlott Rakes MD

## 2017-10-30 NOTE — Patient Instructions (Signed)
Artritis  (Arthritis)  El significado del trmino artritis es dolor de las articulaciones. Tambin puede significar enfermedad articular. Una articulacin es el lugar de unin de los huesos. Las personas que sufren artritis pueden tener lo siguiente:   Enrojecimiento de las articulaciones.   Inflamaciones articulares.   Articulaciones rgidas.   Articulaciones calientes.   Fiebre.   Sensacin de estar enfermo.  CUIDADOS EN EL HOGAR  Est atento a cualquier cambio en los sntomas. Tome estas medidas para aliviar el dolor y la hinchazn.  Medicamentos   Tome los medicamentos de venta libre y los recetados solamente como se lo haya indicado el mdico.   No tome aspirina para aliviar el dolor si el mdico le dice que puede tener gota.  Actividades   Ponga la articulacin en reposo si el mdico se lo indica.   Evite las actividades que intensifiquen el dolor.   Haga ejercicios con la articulacin como se lo haya indicado el mdico. Intente hacer ejercicios tales como:  ? Natacin.  ? Gimnasia acutica.  ? Andar en bicicleta.  ? Caminar.  Cuidado de la articulacin   Si la articulacin est hinchada, mantngala elevada si el mdico se lo indic.   Si al despertar por la maana, nota que la articulacin est rgida, intente tomar una ducha con agua tibia.   Si es diabtico, no se ponga calor sin consultarle al mdico.   Si se lo indican, pngase calor en la articulacin:  ? Coloque una toalla entre la articulacin y la compresa caliente o la almohadilla trmica.  ? Coloque el calor en la zona durante 20 o 30minutos.   Si se lo indican, pngase hielo en la articulacin:  ? Ponga el hielo en una bolsa plstica.  ? Coloque una toalla entre la piel y la bolsa de hielo.  ? Coloque el hielo durante 20minutos, 2 a 3veces por da.   Concurra a todas las visitas de control como se lo haya indicado el mdico.  SOLICITE AYUDA SI:   El dolor empeora.   Tiene fiebre.  SOLICITE AYUDA DE INMEDIATO SI:   Siente un  dolor muy intenso en la articulacin.   Se le hincha la articulacin.   La articulacin est enrojecida.   Siente dolor en muchas articulaciones y se le hinchan.   Siente un dolor muy intenso en la espalda.   Tiene la pierna muy dbil.   No puede controlar la miccin (orina) o la defecacin (materia fecal).  Esta informacin no tiene como fin reemplazar el consejo del mdico. Asegrese de hacerle al mdico cualquier pregunta que tenga.  Document Released: 03/18/2012 Document Revised: 01/09/2016 Document Reviewed: 12/13/2014  Elsevier Interactive Patient Education  2018 Elsevier Inc.

## 2017-10-30 NOTE — Progress Notes (Signed)
Patient has only been taking half of her BP medication.

## 2017-10-31 LAB — CMP14+EGFR
A/G RATIO: 1.3 (ref 1.2–2.2)
ALBUMIN: 4.3 g/dL (ref 3.5–5.5)
ALT: 17 IU/L (ref 0–32)
AST: 16 IU/L (ref 0–40)
Alkaline Phosphatase: 52 IU/L (ref 39–117)
BILIRUBIN TOTAL: 0.2 mg/dL (ref 0.0–1.2)
BUN / CREAT RATIO: 16 (ref 9–23)
BUN: 10 mg/dL (ref 6–24)
CO2: 24 mmol/L (ref 20–29)
Calcium: 9.2 mg/dL (ref 8.7–10.2)
Chloride: 100 mmol/L (ref 96–106)
Creatinine, Ser: 0.63 mg/dL (ref 0.57–1.00)
GFR calc Af Amer: 126 mL/min/{1.73_m2} (ref >59)
GFR calc non Af Amer: 109 mL/min/{1.73_m2} (ref >59)
Globulin, Total: 3.2 g/dL (ref 1.5–4.5)
Glucose: 90 mg/dL (ref 65–99)
POTASSIUM: 3.6 mmol/L (ref 3.5–5.2)
SODIUM: 139 mmol/L (ref 134–144)
Total Protein: 7.5 g/dL (ref 6.0–8.5)

## 2017-11-01 ENCOUNTER — Telehealth: Payer: Self-pay

## 2017-11-01 NOTE — Telephone Encounter (Signed)
Patient was called and informed of lab results via interpretor. 

## 2017-11-04 ENCOUNTER — Telehealth: Payer: Self-pay | Admitting: Internal Medicine

## 2017-11-04 DIAGNOSIS — I10 Essential (primary) hypertension: Secondary | ICD-10-CM

## 2017-11-04 DIAGNOSIS — S161XXA Strain of muscle, fascia and tendon at neck level, initial encounter: Secondary | ICD-10-CM

## 2017-11-04 DIAGNOSIS — M2550 Pain in unspecified joint: Secondary | ICD-10-CM

## 2017-11-04 MED ORDER — AMLODIPINE BESYLATE 10 MG PO TABS: 10.0000 mg | ORAL_TABLET | Freq: Every day | ORAL | 3 refills | Status: DC

## 2017-11-04 MED ORDER — CYCLOBENZAPRINE HCL 5 MG PO TABS: 5.0000 mg | ORAL_TABLET | Freq: Every day | ORAL | 1 refills | Status: DC | PRN

## 2017-11-04 MED ORDER — NAPROXEN 500 MG PO TABS: 500.0000 mg | ORAL_TABLET | Freq: Two times a day (BID) | ORAL | 3 refills | Status: DC

## 2017-11-04 NOTE — Telephone Encounter (Signed)
Pt husband called to get the refill for amLODipine (NORVASC) 10 MG tablet cyclobenzaprine (FLEXERIL) 5 MG tablet naproxen (NAPROSYN) 500 MG tablet  traMADol (ULTRAM) 50 MG tablet Please sent it to [id number] - [place], [place] - [address] Please follow up

## 2017-11-04 NOTE — Telephone Encounter (Signed)
I have refilled the amlodipine. I will not refill the pain medications. Will forward to PCP

## 2018-01-22 ENCOUNTER — Ambulatory Visit: Payer: BLUE CROSS/BLUE SHIELD

## 2018-01-29 ENCOUNTER — Ambulatory Visit: Payer: BLUE CROSS/BLUE SHIELD | Attending: Family Medicine | Admitting: Physician Assistant

## 2018-01-29 VITALS — BP 144/100 | HR 79 | Temp 98.0°F | Resp 16 | Wt 175.6 lb

## 2018-01-29 DIAGNOSIS — R52 Pain, unspecified: Secondary | ICD-10-CM | POA: Diagnosis not present

## 2018-01-29 DIAGNOSIS — M7712 Lateral epicondylitis, left elbow: Secondary | ICD-10-CM | POA: Insufficient documentation

## 2018-01-29 DIAGNOSIS — M25522 Pain in left elbow: Secondary | ICD-10-CM | POA: Diagnosis not present

## 2018-01-29 DIAGNOSIS — Z87442 Personal history of urinary calculi: Secondary | ICD-10-CM | POA: Insufficient documentation

## 2018-01-29 DIAGNOSIS — Z789 Other specified health status: Secondary | ICD-10-CM | POA: Diagnosis not present

## 2018-01-29 DIAGNOSIS — M2550 Pain in unspecified joint: Secondary | ICD-10-CM | POA: Diagnosis present

## 2018-01-29 DIAGNOSIS — R5383 Other fatigue: Secondary | ICD-10-CM

## 2018-01-29 DIAGNOSIS — N289 Disorder of kidney and ureter, unspecified: Secondary | ICD-10-CM | POA: Insufficient documentation

## 2018-01-29 DIAGNOSIS — I10 Essential (primary) hypertension: Secondary | ICD-10-CM | POA: Diagnosis not present

## 2018-01-29 DIAGNOSIS — Z758 Other problems related to medical facilities and other health care: Secondary | ICD-10-CM

## 2018-01-29 DIAGNOSIS — Z79899 Other long term (current) drug therapy: Secondary | ICD-10-CM | POA: Insufficient documentation

## 2018-01-29 MED ORDER — HYDROCHLOROTHIAZIDE 25 MG PO TABS: 25.0000 mg | ORAL_TABLET | Freq: Every day | ORAL | 3 refills | Status: DC

## 2018-01-29 MED ORDER — NAPROXEN 500 MG PO TABS: 500.0000 mg | ORAL_TABLET | Freq: Two times a day (BID) | ORAL | 3 refills | Status: DC

## 2018-01-29 MED ORDER — AMLODIPINE BESYLATE 10 MG PO TABS: 10.0000 mg | ORAL_TABLET | Freq: Every day | ORAL | 3 refills | Status: DC

## 2018-01-29 NOTE — Progress Notes (Signed)
Patient ID: Debbie Stone, female   DOB: 05-Apr-1971, 47 y.o.   MRN: [id number]   Laporche Martelle, is a 47 y.o. female  [id number]:[id number]  [id number]  DOB - 1971/05/15  Subjective:  Chief Complaint and HPI: Cecia Egge is a 47 y.o. female here today for several issues.  L elbow pain for several months.  She has tried advil/tylenol.  No f/c.  This has been bothering her for several months.  She is also c/o generalized body aches and all over joint pain since about November 2018.  No tick bites.  No f/c.  Elbows and ankles seem most affected.    Also c/o generalized fatigue for several months.  Periods are regular.    Taking BP meds as directed.  Not checking BP OOO.  No HA/CP/SOB/dizziness.  Family member is translating.    Social:  Works in house-keeping at a hotel  ROS:   Constitutional:  No f/c, No night sweats, No unexplained weight loss. EENT:  No vision changes, No blurry vision, No hearing changes. No mouth, throat, or ear problems.  Respiratory: No cough, No SOB Cardiac: No CP, no palpitations GI:  No abd pain, No N/V/D. GU: No Urinary s/sx Musculoskeletal: body pain Neuro: No headache, no dizziness, no motor weakness.  Skin: No rash Endocrine:  No polydipsia. No polyuria.  Psych: Denies SI/HI  No problems updated.  ALLERGIES: No Known Allergies  PAST MEDICAL HISTORY: Past Medical History:  Diagnosis Date  . Hypertension   . Renal disorder    hx of kidney stones    MEDICATIONS AT HOME: Prior to Admission medications   Medication Sig Start Date End Date Taking? Authorizing Provider  amLODipine (NORVASC) 10 MG tablet Take 1 tablet (10 mg total) by mouth daily. 01/29/18   Anders Simmonds, PA-C  cyclobenzaprine (FLEXERIL) 5 MG tablet Take 1 tablet (5 mg total) by mouth daily as needed for muscle spasms. 11/04/17   Marcine Matar, MD  hydrochlorothiazide (HYDRODIURIL) 25 MG tablet Take 1 tablet (25 mg total) by mouth  daily. 01/29/18   Anders Simmonds, PA-C  naproxen (NAPROSYN) 500 MG tablet Take 1 tablet (500 mg total) by mouth 2 (two) times daily with a meal. 01/29/18   Demarques Pilz, Marzella Schlein, PA-C  traMADol (ULTRAM) 50 MG tablet Take 1 tablet (50 mg total) by mouth every 8 (eight) hours as needed. Patient not taking: Reported on 10/30/2017 03/19/16   Pete Glatter, MD     Objective:  EXAM:   Vitals:   01/29/18 1037  BP: (!) 144/100  Pulse: 79  Resp: 16  Temp: 98 F (36.7 C)  TempSrc: Oral  SpO2: 100%  Weight: 175 lb 9.6 oz (79.7 kg)    General appearance : A&OX3. NAD. Non-toxic-appearing HEENT: Atraumatic and Normocephalic.  PERRLA. Mouth-MMM, post pharynx WNL w/o erythema, No PND. Neck: supple, no JVD. No cervical lymphadenopathy. No thyromegaly Chest/Lungs:  Breathing-non-labored, Good air entry bilaterally, breath sounds normal without rales, rhonchi, or wheezing  CVS: S1 S2 regular, no murmurs, gallops, rubs  Extremities: joints examined without any gross abnormalities or limited ROM.  L elbow with TTP on lateral epicondyle-no other specific point tenderness.  Bilateral Lower Ext shows no edema, both legs are warm to touch with = pulse throughout Neurology:  CN II-XII grossly intact, Non focal.   Psych:  TP linear. J/I WNL. Normal speech. Appropriate eye contact and affect.  Skin:  No Rash  Data Review Lab Results  Component  Value Date   HGBA1C 5.4 03/21/2017   HGBA1C 5.3 12/07/2015     Assessment & Plan   1. Body aches - Vitamin D, 25-hydroxy - naproxen (NAPROSYN) 500 MG tablet; Take 1 tablet (500 mg total) by mouth 2 (two) times daily with a meal.  Dispense: 60 tablet; Refill: 3  2. Essential hypertension Uncontrolled-add HCTZ  daily.  Check BP 2-3 times/week and record and bring to next visit - amLODipine (NORVASC) 10 MG tablet; Take 1 tablet (10 mg total) by mouth daily.  Dispense: 90 tablet; Refill: 3  3. Fatigue, unspecified type - Vitamin D, 25-hydroxy - TSH - CBC  with Differential/Platelet  4. Arthralgia, unspecified joint With specific L lateral epicondylitis.  Obtain tennis elbow strap.   - Vitamin D, 25-hydroxy - Sedimentation Rate - naproxen (NAPROSYN) 500 MG tablet; Take 1 tablet (500 mg total) by mouth 2 (two) times daily with a meal.  Dispense: 60 tablet; Refill: 3  5. Language barrier Family member translating.       Patient have been counseled extensively about nutrition and exercise  Return in about 6 weeks (around 03/12/2018) for Dr Laural Benes; f/up joint pain and body aches/fatigue.  The patient was given clear instructions to go to ER or return to medical center if symptoms don't improve, worsen or new problems develop. The patient verbalized understanding. The patient was told to call to get lab results if they haven't heard anything in the next week.     Georgian Co, PA-C [facility] [place], [place] [phone]   01/29/2018, 10:50 AM

## 2018-01-29 NOTE — Patient Instructions (Addendum)
Check BP 2-3 times weekly and record and bring to next visit.     Hipertensin Hypertension El trmino hipertensin es otra forma de denominar a la presin arterial elevada. La presin arterial elevada fuerza al corazn a trabajar ms para bombear la sangre. Esto puede causar problemas con el paso del [place]. Una lectura de presin arterial est compuesta por 2 nmeros. Hay un nmero superior (sistlico) sobre un nmero inferior (diastlico). Lo ideal es tener la presin arterial por debajo de 120/80. Las decisiones saludables pueden ayudarle a disminuir su presin arterial. Es posible que necesite medicamentos que le ayuden a disminuir su presin arterial si:  Su presin arterial no disminuye mediante decisiones saludables.  Su presin arterial est por encima de 130/80.  Siga estas instrucciones en su casa: Comida y bebida  Si se lo indican, siga el plan de alimentacin de DASH (Dietary Approaches to Stop Hypertension, Maneras de alimentarse para detener la hipertensin). Esta dieta incluye: ? Que la mitad del plato de cada comida sea de frutas y verduras. ? Que un cuarto del plato de cada comida sea de cereales integrales. Los cereales integrales incluyen pasta integral, arroz integral y pan integral. ? Comer y beber productos lcteos con bajo contenido de [place], como leche descremada o yogur bajo en grasas. ? Que un cuarto del plato de cada comida sea de protenas bajas en grasa (magras). Las protenas bajas en grasa incluyen pescado, pollo sin piel, huevos, frijoles y tofu. ? Evitar consumir carne grasa, carne curada y procesada, o pollo con piel. ? Evitar consumir alimentos prehechos o procesados.  Consuma menos de 1500 mg de sal (sodio) por da.  Limite el consumo de alcohol a no ms de 1 medida por da si es mujer y no est Orthoptist y a 2 medidas por da si es hombre. Una medida equivale a 12onzas de cerveza, 5onzas de vino o 1onzas de bebidas alcohlicas de alta  graduacin. Estilo de vida  Trabaje con su mdico para mantenerse en un peso saludable o para perder peso. Pregntele a su mdico cul es el peso recomendable para usted.  Realice al menos 30 minutos de ejercicio que haga que se acelere su corazn (ejercicio Magazine features editor) la [employer] de la [place]. Estos pueden incluir caminar, nadar o andar en bicicleta.  Realice al menos 30 minutos de ejercicio que fortalezca sus msculos (ejercicios de resistencia) al menos 3 das a la [place]. Estos pueden incluir levantar pesas o hacer pilates.  No consuma ningn producto que contenga nicotina o tabaco. Esto incluye cigarrillos y cigarrillos electrnicos. Si necesita ayuda para dejar de fumar, consulte al [employer].  Controle su presin arterial en su casa tal como le indic el mdico.  Concurra a todas las visitas de control como se lo haya indicado el mdico. Esto es importante. Medicamentos  [employer] de venta libre y los recetados solamente como se lo haya indicado el mdico. Siga cuidadosamente las indicaciones.  No omita las dosis de medicamentos para la presin arterial. Los medicamentos pierden eficacia si omite dosis. El hecho de omitir las dosis tambin [place] el riesgo de otros problemas.  Pregntele a su mdico a qu efectos secundarios o reacciones a los Museum/gallery curator. Comunquese con un mdico si:  Piensa que tiene [place] reaccin a los medicamentos que est tomando.  Tiene dolores de cabeza frecuentes (recurrentes).  Siente mareos.  Tiene hinchazn en los tobillos.  Tiene problemas de visin. Solicite ayuda de inmediato si:  Freight forwarder  de cabeza muy intenso.  Comienza a sentirse confundido.  Se siente dbil o adormecido.  Siente que va a desmayarse.  Siente un dolor muy intenso en: ? El pecho. ? El vientre (abdomen).  Devuelve (vomita) ms de una vez.  Tiene dificultad para respirar. Resumen  El trmino hipertensin es otra  forma de denominar a la presin arterial elevada.  Las decisiones saludables pueden ayudarle a disminuir su presin arterial. Si no puede controlar su presin arterial mediante decisiones saludables, es posible que deba tomar medicamentos. Esta informacin no tiene Theme park manager el consejo del mdico. Asegrese de hacerle al mdico cualquier pregunta que tenga. Document Released: 03/07/2010 Document Revised: 08/29/2016 Document Reviewed: 08/29/2016 Elsevier Interactive Patient Education  2018 [employer].    Tennis elbow strap  Codo de Chartered loss adjuster (Tennis Elbow) El codo de tenista es la hinchazn (inflamacin) de los tendones externos del antebrazo cerca del codo. Los tendones [employer] Data con los [place]. Esta afeccin puede presentarse si practica un deporte o realiza una tarea que exige el uso excesivo del codo. La causa del codo de [place] es la repeticin del mismo movimiento una y Laverda Page. El codo de [place] puede causar lo siguiente:  Dolor y sensibilidad a la palpacin en el Product manager y la parte externa del codo.  Sensacin de ardor que se extiende desde el codo Scientist, research (physical sciences).  Agarre dbil de las manos. CUIDADOS EN EL HOGAR Actividad  Ponga el codo y Warden/ranger en reposo como se lo haya indicado el mdico. Intente no realizar ninguna actividad que pueda haber causado el problema hasta que el mdico le permita reanudarla.  Si un fisioterapeuta le ensea ejercicios, hgalos como se lo hayan indicado.  Si levanta un objeto, hgalo con la palma de la mano [place]. [employer], se reduce el esfuerzo en el codo. Estilo de vida  Si el codo de [place] se debe a los deportes, revise el equipo y Manufacturing engineer de lo siguiente: ? Que lo [place] correctamente. ? Que es apto para usted.  Si el codo de [place] se debe al Aleen Campi, tmese descansos con frecuencia, si es posible. Hable con el gerente respecto de hacer su trabajo de un modo que sea seguro para usted. ? Si el codo de  [place] se debe al uso de la computadora, hable con el gerente United [employer]ers cambios que pueden hacerse en su lugar de [place]. Instrucciones generales  Si se lo indican, aplique hielo sobre la zona dolorida: ? Nature conservation officer hielo en una bolsa plstica. ? Coloque una [employer] piel y la bolsa de hielo. ? Coloque el hielo durante , 2 a 3veces por da.  Tome los medicamentos solamente como se lo haya indicado el mdico.  Si le aconsejaron usar un dispositivo ortopdico, selo como se lo haya indicado el mdico.  Concurra a todas las visitas de control como se lo haya indicado el mdico. Esto es importante. SOLICITE AYUDA SI:  El dolor no mejora con Scientist, research (medical).  El dolor [place].  Tiene debilidad en el antebrazo, la mano o los dedos de la [place].  No puede sentir el antebrazo, la mano o los dedos de la [place]. Esta informacin no tiene Theme park manager el consejo del mdico. Asegrese de hacerle al mdico cualquier pregunta que tenga. Document Released: 10/20/2010 Document Revised: 02/01/2015 Document Reviewed: 09/13/2014 Elsevier Interactive Patient Education  [employer].

## 2018-01-30 ENCOUNTER — Other Ambulatory Visit: Payer: Self-pay | Admitting: Physician Assistant

## 2018-01-30 ENCOUNTER — Telehealth: Payer: Self-pay

## 2018-01-30 LAB — CBC WITH DIFFERENTIAL/PLATELET
BASOS: 0 %
Basophils Absolute: 0 10*3/uL (ref 0.0–0.2)
EOS (ABSOLUTE): 0.1 10*3/uL (ref 0.0–0.4)
EOS: 1 %
HEMATOCRIT: 36.5 % (ref 34.0–46.6)
HEMOGLOBIN: 11.5 g/dL (ref 11.1–15.9)
IMMATURE GRANS (ABS): 0 10*3/uL (ref 0.0–0.1)
IMMATURE GRANULOCYTES: 0 %
LYMPHS: 40 %
Lymphocytes Absolute: 2 10*3/uL (ref 0.7–3.1)
MCH: 22.8 pg — ABNORMAL LOW (ref 26.6–33.0)
MCHC: 31.5 g/dL (ref 31.5–35.7)
MCV: 72 fL — AB (ref 79–97)
MONOCYTES: 5 %
MONOS ABS: 0.3 10*3/uL (ref 0.1–0.9)
NEUTROS PCT: 54 %
Neutrophils Absolute: 2.7 10*3/uL (ref 1.4–7.0)
Platelets: 313 10*3/uL (ref 150–379)
RBC: 5.04 x10E6/uL (ref 3.77–5.28)
RDW: 15.7 % — AB (ref 12.3–15.4)
WBC: 5 10*3/uL (ref 3.4–10.8)

## 2018-01-30 LAB — SEDIMENTATION RATE: Sed Rate: 48 mm/hr — ABNORMAL HIGH (ref 0–32)

## 2018-01-30 LAB — VITAMIN D 25 HYDROXY (VIT D DEFICIENCY, FRACTURES): VIT D 25 HYDROXY: 20 ng/mL — AB (ref 30.0–100.0)

## 2018-01-30 LAB — TSH: TSH: 0.808 u[IU]/mL (ref 0.450–4.500)

## 2018-01-30 MED ORDER — VITAMIN D (ERGOCALCIFEROL) 1.25 MG (50000 UNIT) PO CAPS: 50000.0000 [IU] | ORAL_CAPSULE | ORAL | 0 refills | Status: DC

## 2018-01-30 NOTE — Telephone Encounter (Signed)
CMA call regarding lab results   Patient did not answer but left a detailed message & to call back if have any questions  

## 2018-01-30 NOTE — Telephone Encounter (Signed)
-----   Message from Particia Lather, [place] sent at 01/30/2018  4:09 PM EDT -----   ----- Message ----- From: Anders Simmonds, PA-C Sent: 01/30/2018   8:41 AM To: Particia Lather, RMA  Please call patient and let them that their vitamin D is low.  This can contribute to muscle aches, anxiety, fatigue, and depression.  I have sent a prescription to the pharmacy for them to take once a week.  We will recheck this level in 3-4 months.  Her thyroid function was normal.  Her inflammatory rate was little elevated and will be followed/rechecked at follow-up appt.  Thanks, Georgian Co, PA-C

## 2018-02-03 ENCOUNTER — Telehealth: Payer: Self-pay | Admitting: Internal Medicine

## 2018-02-03 NOTE — Telephone Encounter (Signed)
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

## 2018-02-04 ENCOUNTER — Other Ambulatory Visit: Payer: Self-pay | Admitting: Family Medicine

## 2018-02-04 ENCOUNTER — Other Ambulatory Visit: Payer: Self-pay

## 2018-02-04 MED ORDER — VITAMIN D (ERGOCALCIFEROL) 1.25 MG (50000 UNIT) PO CAPS: 50000.0000 [IU] | ORAL_CAPSULE | ORAL | 0 refills | Status: DC

## 2018-02-04 NOTE — Telephone Encounter (Signed)
Ready for pick up

## 2018-02-21 ENCOUNTER — Other Ambulatory Visit: Payer: Self-pay | Admitting: Internal Medicine

## 2018-02-21 DIAGNOSIS — S161XXA Strain of muscle, fascia and tendon at neck level, initial encounter: Secondary | ICD-10-CM

## 2018-02-21 DIAGNOSIS — M2550 Pain in unspecified joint: Secondary | ICD-10-CM

## 2018-02-26 ENCOUNTER — Other Ambulatory Visit: Payer: Self-pay

## 2018-02-26 DIAGNOSIS — M2550 Pain in unspecified joint: Secondary | ICD-10-CM

## 2018-02-26 DIAGNOSIS — S161XXA Strain of muscle, fascia and tendon at neck level, initial encounter: Secondary | ICD-10-CM

## 2018-03-13 ENCOUNTER — Ambulatory Visit: Payer: BLUE CROSS/BLUE SHIELD | Admitting: Internal Medicine

## 2018-04-28 ENCOUNTER — Other Ambulatory Visit: Payer: Self-pay | Admitting: Family Medicine

## 2018-04-28 DIAGNOSIS — I10 Essential (primary) hypertension: Secondary | ICD-10-CM

## 2018-04-30 ENCOUNTER — Telehealth: Payer: Self-pay

## 2018-04-30 NOTE — Telephone Encounter (Signed)
error 

## 2018-09-28 ENCOUNTER — Other Ambulatory Visit: Payer: Self-pay | Admitting: Family Medicine

## 2018-09-28 DIAGNOSIS — I10 Essential (primary) hypertension: Secondary | ICD-10-CM

## 2018-09-29 ENCOUNTER — Other Ambulatory Visit: Payer: Self-pay | Admitting: Family Medicine

## 2018-09-29 DIAGNOSIS — I10 Essential (primary) hypertension: Secondary | ICD-10-CM

## 2018-09-29 MED ORDER — AMLODIPINE BESYLATE 10 MG PO TABS: 10.0000 mg | ORAL_TABLET | Freq: Every day | ORAL | 0 refills | Status: DC

## 2018-09-29 MED ORDER — HYDROCHLOROTHIAZIDE 25 MG PO TABS: 25.0000 mg | ORAL_TABLET | Freq: Every day | ORAL | 0 refills | Status: DC

## 2018-09-29 MED FILL — AMLODIPINE BESYLATE 10 MG T: 10 | 30 days supply | Qty: 30 | Fill #0

## 2018-09-29 MED FILL — HYDROCHLOROTHIAZIDE 25 MG T: 25 | 30 days supply | Qty: 30 | Fill #0

## 2018-09-30 ENCOUNTER — Other Ambulatory Visit: Payer: Self-pay | Admitting: Family Medicine

## 2018-09-30 DIAGNOSIS — I10 Essential (primary) hypertension: Secondary | ICD-10-CM

## 2018-10-17 ENCOUNTER — Ambulatory Visit: Payer: BLUE CROSS/BLUE SHIELD | Admitting: Internal Medicine

## 2018-11-25 ENCOUNTER — Other Ambulatory Visit: Payer: Self-pay | Admitting: Internal Medicine

## 2018-11-25 DIAGNOSIS — I10 Essential (primary) hypertension: Secondary | ICD-10-CM

## 2019-01-21 ENCOUNTER — Emergency Department (HOSPITAL_COMMUNITY): Payer: BLUE CROSS/BLUE SHIELD

## 2019-01-21 ENCOUNTER — Other Ambulatory Visit: Payer: Self-pay

## 2019-01-21 ENCOUNTER — Encounter (HOSPITAL_COMMUNITY): Payer: Self-pay

## 2019-01-21 ENCOUNTER — Emergency Department (HOSPITAL_COMMUNITY)
Admission: EM | Admit: 2019-01-21 | Discharge: 2019-01-21 | Disposition: A | Discharge status: Home / Self Care | Payer: BLUE CROSS/BLUE SHIELD | Attending: Emergency Medicine | Admitting: Emergency Medicine

## 2019-01-21 DIAGNOSIS — I10 Essential (primary) hypertension: Secondary | ICD-10-CM | POA: Diagnosis not present

## 2019-01-21 DIAGNOSIS — B9789 Other viral agents as the cause of diseases classified elsewhere: Secondary | ICD-10-CM

## 2019-01-21 DIAGNOSIS — E119 Type 2 diabetes mellitus without complications: Secondary | ICD-10-CM | POA: Insufficient documentation

## 2019-01-21 DIAGNOSIS — Z79899 Other long term (current) drug therapy: Secondary | ICD-10-CM | POA: Insufficient documentation

## 2019-01-21 DIAGNOSIS — R05 Cough: Secondary | ICD-10-CM | POA: Diagnosis present

## 2019-01-21 DIAGNOSIS — J069 Acute upper respiratory infection, unspecified: Secondary | ICD-10-CM | POA: Diagnosis not present

## 2019-01-21 NOTE — ED Provider Notes (Signed)
[facility] EMERGENCY DEPARTMENT Provider Note   CSN: [id number] Arrival date & time: 01/21/19  1631    History   Chief Complaint Chief Complaint  Patient presents with  . Cough    HPI Debbie Stone is a 48 y.o. female.     The history is provided by the patient.  URI  Presenting symptoms: cough and fever   Presenting symptoms: no ear pain and no sore throat   Severity:  Mild Onset quality:  Gradual Duration:  2 days Timing:  Intermittent Progression:  Waxing and waning Chronicity:  New Relieved by:  Nothing Worsened by:  Nothing Ineffective treatments:  None tried Associated symptoms: myalgias and wheezing   Associated symptoms: no arthralgias   Risk factors: sick contacts     Past Medical History:  Diagnosis Date  . Hypertension   . Renal disorder    hx of kidney stones    Patient Active Problem List   Diagnosis Date Noted  . Anemia 04/08/2017  . Diabetes mellitus screening 03/21/2017  . Chronic lower back pain 05/23/2016  . Adnexal cyst 03/19/2016  . Hypertensive urgency 12/07/2015  . Healthcare maintenance 12/07/2015  . Essential hypertension 12/07/2015    Past Surgical History:  Procedure Laterality Date  . CESAREAN SECTION    . LUNG SURGERY       OB History   No obstetric history on file.      Home Medications    Prior to Admission medications   Medication Sig Start Date End Date Taking? Authorizing Provider  amLODipine (NORVASC) 10 MG tablet TAKE 1 TABLET BY MOUTH ONCE DAILY 10/02/18   Marcine Matar, MD  cyclobenzaprine (FLEXERIL) 5 MG tablet TAKE 1 TABLET BY MOUTH ONCE DAILY AS NEEDED FOR MUSCLE SPASM 02/25/18   Marcine Matar, MD  hydrochlorothiazide (HYDRODIURIL) 25 MG tablet Take 1 tablet (25 mg total) by mouth daily. 09/29/18   Hoy Register, MD  naproxen (NAPROSYN) 500 MG tablet Take 1 tablet (500 mg total) by mouth 2 (two) times daily with a meal. 01/29/18   McClung, Marzella Schlein, PA-C   traMADol (ULTRAM) 50 MG tablet Take 1 tablet (50 mg total) by mouth every 8 (eight) hours as needed. Patient not taking: Reported on 10/30/2017 03/19/16   Pete Glatter, MD  Vitamin D, Ergocalciferol, (DRISDOL) 50000 units CAPS capsule Take 1 capsule (50,000 Units total) by mouth every 7 (seven) days. 02/04/18   Hoy Register, MD    Family History History reviewed. No pertinent family history.  Social History Social History   Tobacco Use  . Smoking status: Never Smoker  . Smokeless tobacco: Never Used  Substance Use Topics  . Alcohol use: No  . Drug use: No     Allergies   Patient has no known allergies.   Review of Systems Review of Systems  Constitutional: Positive for fever. Negative for chills.  HENT: Negative for ear pain and sore throat.   Eyes: Negative for pain and visual disturbance.  Respiratory: Positive for cough and wheezing. Negative for shortness of breath.   Cardiovascular: Negative for chest pain and palpitations.  Gastrointestinal: Negative for abdominal pain and vomiting.  Genitourinary: Negative for dysuria and hematuria.  Musculoskeletal: Positive for myalgias. Negative for arthralgias and back pain.  Skin: Negative for color change and rash.  Neurological: Negative for seizures and syncope.  All other systems reviewed and are negative.    Physical Exam Updated Vital Signs BP (!) 145/94 (BP Location: Right Arm)  Pulse 85   Temp 98.7 F (37.1 C) (Oral)   Resp 18   Ht 5\' 5"  (1.651 m)   Wt 79.4 kg   LMP 01/02/2019   SpO2 99%   BMI 29.12 kg/m   Physical Exam Vitals signs and nursing note reviewed.  Constitutional:      General: She is not in acute distress.    Appearance: She is well-developed.  HENT:     Head: Normocephalic and atraumatic.     Mouth/Throat:     Mouth: Mucous membranes are moist.  Eyes:     Extraocular Movements: Extraocular movements intact.     Conjunctiva/sclera: Conjunctivae normal.     Pupils: Pupils are  equal, round, and reactive to light.  Neck:     Musculoskeletal: Neck supple.  Cardiovascular:     Rate and Rhythm: Normal rate and regular rhythm.     Pulses: Normal pulses.     Heart sounds: Normal heart sounds. No murmur.  Pulmonary:     Effort: Pulmonary effort is normal. No respiratory distress.     Breath sounds: Normal breath sounds.  Abdominal:     Palpations: Abdomen is soft.     Tenderness: There is no abdominal tenderness.  Musculoskeletal: Normal range of motion.  Skin:    General: Skin is warm and dry.  Neurological:     General: No focal deficit present.     Mental Status: She is alert.      ED Treatments / Results  Labs (all labs ordered are listed, but only abnormal results are displayed) Labs Reviewed - No data to display  EKG None  Radiology Dg Chest Portable 1 View  Result Date: 01/21/2019 CLINICAL DATA:  Cough, body aches EXAM: PORTABLE CHEST 1 VIEW COMPARISON:  None. FINDINGS: The heart size and mediastinal contours are within normal limits. Both lungs are clear. The visualized skeletal structures are unremarkable. IMPRESSION: No active disease. Electronically Signed   By: Elige KoHetal  Patel   On: 01/21/2019 17:56    Procedures Procedures (including critical care time)  Medications Ordered in ED Medications - No data to display   Initial Impression / Assessment and Plan / ED Course  I have reviewed the triage vital signs and the nursing notes.  Pertinent labs & imaging results that were available during my care of the patient were reviewed by me and considered in my medical decision making (see chart for details).     Debbie Gianottilizabeth Vasquez Darrold JunkerDe Stone is a 48 year old female who presents to the ED for body aches, cough.  Patient with family members were coronavirus.  Patient with normal vitals.  No fever.  Normal work of breathing. No SOB.  No signs of respiratory distress.  Normal chest x-ray.  Given instructions for home isolation.  Patient overall with  very mild symptoms.  Given return precautions and discharged from ED in good condition.  This chart was dictated using voice recognition software.  Despite best efforts to proofread,  errors can occur which can change the documentation meaning.    Debbie Prestolizabeth Vasquez De Stone was evaluated in Emergency Department on 01/21/2019 for the symptoms described in the history of present illness. She was evaluated in the context of the global COVID-19 pandemic, which necessitated consideration that the patient might be at risk for infection with the SARS-CoV-2 virus that causes COVID-19. Institutional protocols and algorithms that pertain to the evaluation of patients at risk for COVID-19 are in a state of rapid change based on information released by regulatory  bodies including the CDC and federal and state organizations. These policies and algorithms were followed during the patient's care in the ED.  This chart was dictated using voice recognition software.  Despite best efforts to proofread,  errors can occur which can change the documentation meaning.    Final Clinical Impressions(s) / ED Diagnoses   Final diagnoses:  Viral URI with cough    ED Discharge Orders    None       Virgina Norfolk, DO 01/21/19 1846

## 2019-01-21 NOTE — ED Triage Notes (Signed)
Pt arrives POV for eval of body aches and flu like sx. Pt reports family member w/ +COVID-19 dx. Pt in NARD, NAD.

## 2019-01-21 NOTE — Discharge Instructions (Addendum)
°If you live with, or provide care at home for, a person confirmed to have, or being evaluated for, COVID-19 infection °please follow these guidelines to prevent infection: ° °Follow healthcare provider’s instructions °Make sure that you understand and can help the patient follow any healthcare provider instructions for all care. ° °Provide for the patient’s basic needs °You should help the patient with basic needs in the home and provide support for getting groceries, prescriptions, and °other personal needs. ° °Monitor the patient’s symptoms °If they are getting sicker, call his or her medical provider a ° This will help the healthcare provider’s office take steps to keep other people from getting infected. °Ask the healthcare provider to call the local or state health department. ° °Limit the number of people who have contact with the patient °If possible, have only one caregiver for the patient. °Other household members should stay in another home or place of residence. If this is not possible, they should stay °in another room, or be separated from the patient as much as possible. Use a separate bathroom, if available. °Restrict visitors who do not have an essential need to be in the home. ° °Keep older adults, very young children, and other sick people away from the patient °Keep older adults, very young children, and those who have compromised immune systems or chronic health conditions away from the patient. This includes people with chronic heart, lung, or kidney conditions, diabetes, and cancer. ° °Ensure good ventilation °Make sure that shared spaces in the home have good air flow, such as from an air conditioner or an opened window, °weather permitting. ° °Wash your hands often °Wash your hands often and thoroughly with soap and water for at least 20 seconds. You can use an alcohol based hand sanitizer if soap and water are not available and if your hands are not visibly dirty. °Avoid touching your  eyes, nose, and mouth with unwashed hands. °Use disposable paper towels to dry your hands. If not available, use dedicated cloth towels and replace them when they become wet. ° °Wear a facemask and gloves °Wear a disposable facemask at all times in the room and gloves when you touch or have contact with the patient’s blood, body fluids, and/or secretions or excretions, such as sweat, saliva, sputum, nasal mucus, vomit, urine, or feces.  Ensure the mask fits over your nose and mouth tightly, and do not touch it during use. °Throw out disposable facemasks and gloves after using them. Do not reuse. °Wash your hands immediately after removing your facemask and gloves. °If your personal clothing becomes contaminated, carefully remove clothing and launder. Wash your hands after handling contaminated clothing. °Place all used disposable facemasks, gloves, and other waste in a lined container before disposing them with other household waste. °Remove gloves and wash your hands immediately after handling these items. ° °Do not share dishes, glasses, or other household items with the patient °Avoid sharing household items. You should not share dishes, drinking glasses, cups, eating utensils, towels, bedding, or other items °After the person uses these items, you should wash them thoroughly with soap and water. ° °Wash laundry thoroughly °Immediately remove and wash clothes or bedding that have blood, body fluids, and/or secretions or excretions, such as sweat, saliva, sputum, nasal mucus, vomit, urine, or feces, on them. °Wear gloves when handling laundry from the patient. °Read and follow directions on labels of laundry or clothing items and detergent. In general, wash and dry with the warmest temperatures recommended on the   label. ° °Clean all areas the individual has used often °Clean all touchable surfaces, such as counters, tabletops, doorknobs, bathroom fixtures, toilets, phones, keyboards, tablets, and bedside tables,  every day. Also, clean any surfaces that may have blood, body fluids, and/or secretions or excretions on them. °Wear gloves when cleaning surfaces the patient has come in contact with. °Use a diluted bleach solution (e.g., dilute bleach with 1 part bleach and 10 parts water) or a household disinfectant with a label that says EPA-registered for coronaviruses. To make a bleach solution at home, add 1 tablespoon of bleach to 1 quart (4 cups) of water. For a larger supply, add ¼ cup of bleach to 1 gallon (16 cups) of water. °Read labels of cleaning products and follow recommendations provided on product labels. Labels contain instructions for safe and effective use of the cleaning product including precautions you should take when applying the product, such as wearing gloves or eye protection and making sure you have good ventilation during use of the product. °Remove gloves and wash hands immediately after cleaning. ° °Monitor yourself for signs and symptoms of illness °Caregivers and household members are considered close contacts, should monitor their health, and will be asked to limit °movement outside of the home to the extent possible. Follow the monitoring steps for close contacts listed on the °symptom monitoring form. ° ° °? If you have additional questions, contact your local health department or call the epidemiologist on call at [phone] °(available 24/7). °? This guidance is subject to change. For the most up-to-date guidance from CDC, please refer to their website: °https://www.cdc.gov/coronavirus/2019-ncov/hcp/guidance-prevent-spread.html  ° °

## 2019-01-30 ENCOUNTER — Other Ambulatory Visit: Payer: Self-pay

## 2019-01-30 ENCOUNTER — Emergency Department (HOSPITAL_COMMUNITY): Payer: BLUE CROSS/BLUE SHIELD

## 2019-01-30 ENCOUNTER — Encounter (HOSPITAL_COMMUNITY): Payer: Self-pay | Admitting: Emergency Medicine

## 2019-01-30 ENCOUNTER — Emergency Department (HOSPITAL_COMMUNITY)
Admission: EM | Admit: 2019-01-30 | Discharge: 2019-01-31 | Disposition: A | Discharge status: Home / Self Care | Payer: BLUE CROSS/BLUE SHIELD | Attending: Emergency Medicine | Admitting: Emergency Medicine

## 2019-01-30 DIAGNOSIS — Z20822 Contact with and (suspected) exposure to covid-19: Secondary | ICD-10-CM

## 2019-01-30 DIAGNOSIS — M7918 Myalgia, other site: Secondary | ICD-10-CM | POA: Insufficient documentation

## 2019-01-30 DIAGNOSIS — R05 Cough: Secondary | ICD-10-CM | POA: Insufficient documentation

## 2019-01-30 DIAGNOSIS — R51 Headache: Secondary | ICD-10-CM | POA: Insufficient documentation

## 2019-01-30 DIAGNOSIS — J069 Acute upper respiratory infection, unspecified: Secondary | ICD-10-CM | POA: Insufficient documentation

## 2019-01-30 DIAGNOSIS — R0981 Nasal congestion: Secondary | ICD-10-CM | POA: Diagnosis not present

## 2019-01-30 DIAGNOSIS — Z20828 Contact with and (suspected) exposure to other viral communicable diseases: Secondary | ICD-10-CM | POA: Diagnosis not present

## 2019-01-30 DIAGNOSIS — Z79899 Other long term (current) drug therapy: Secondary | ICD-10-CM | POA: Diagnosis not present

## 2019-01-30 DIAGNOSIS — R509 Fever, unspecified: Secondary | ICD-10-CM | POA: Diagnosis present

## 2019-01-30 LAB — CBC WITH DIFFERENTIAL/PLATELET
Abs Immature Granulocytes: 0.02 10*3/uL (ref 0.00–0.07)
Basophils Absolute: 0 10*3/uL (ref 0.0–0.1)
Basophils Relative: 0 %
Eosinophils Absolute: 0.1 10*3/uL (ref 0.0–0.5)
Eosinophils Relative: 1 %
HCT: 38 % (ref 36.0–46.0)
Hemoglobin: 11.6 g/dL — ABNORMAL LOW (ref 12.0–15.0)
Immature Granulocytes: 0 %
Lymphocytes Relative: 32 %
Lymphs Abs: 3 10*3/uL (ref 0.7–4.0)
MCH: 22 pg — ABNORMAL LOW (ref 26.0–34.0)
MCHC: 30.5 g/dL (ref 30.0–36.0)
MCV: 72 fL — ABNORMAL LOW (ref 80.0–100.0)
Monocytes Absolute: 0.6 10*3/uL (ref 0.1–1.0)
Monocytes Relative: 7 %
Neutro Abs: 5.5 10*3/uL (ref 1.7–7.7)
Neutrophils Relative %: 60 %
Platelets: 333 10*3/uL (ref 150–400)
RBC: 5.28 MIL/uL — ABNORMAL HIGH (ref 3.87–5.11)
RDW: 13.8 % (ref 11.5–15.5)
WBC: 9.2 10*3/uL (ref 4.0–10.5)
nRBC: 0 % (ref 0.0–0.2)

## 2019-01-30 LAB — COMPREHENSIVE METABOLIC PANEL
ALT: 34 U/L (ref 0–44)
AST: 31 U/L (ref 15–41)
Albumin: 4.1 g/dL (ref 3.5–5.0)
Alkaline Phosphatase: 69 U/L (ref 38–126)
Anion gap: 13 (ref 5–15)
BUN: 8 mg/dL (ref 6–20)
CO2: 25 mmol/L (ref 22–32)
Calcium: 9.5 mg/dL (ref 8.9–10.3)
Chloride: 101 mmol/L (ref 98–111)
Creatinine, Ser: 0.61 mg/dL (ref 0.44–1.00)
GFR calc Af Amer: >60 mL/min (ref >60)
GFR calc non Af Amer: >60 mL/min (ref >60)
Glucose, Bld: 125 mg/dL — ABNORMAL HIGH (ref 70–99)
Potassium: 4.1 mmol/L (ref 3.5–5.1)
Sodium: 139 mmol/L (ref 135–145)
Total Bilirubin: 0.5 mg/dL (ref 0.3–1.2)
Total Protein: 8 g/dL (ref 6.5–8.1)

## 2019-01-30 MED ORDER — ALBUTEROL SULFATE HFA 108 (90 BASE) MCG/ACT IN AERS
2.0000 | INHALATION_SPRAY | RESPIRATORY_TRACT | Status: DC | PRN
  Administered 2019-01-30: 2 via RESPIRATORY_TRACT
  Filled 2019-01-30: qty 6.7

## 2019-01-30 MED ORDER — NAPROXEN 500 MG PO TABS: 500.0000 mg | ORAL_TABLET | Freq: Two times a day (BID) | ORAL | 0 refills | Status: AC | PRN

## 2019-01-30 MED ORDER — IBUPROFEN 800 MG PO TABS
800.0000 mg | ORAL_TABLET | Freq: Once | ORAL | Status: AC
  Administered 2019-01-30: 800 mg via ORAL
  Filled 2019-01-30: qty 1

## 2019-01-30 MED ORDER — ACETAMINOPHEN 500 MG PO TABS
1000.0000 mg | ORAL_TABLET | Freq: Once | ORAL | Status: AC
  Administered 2019-01-30: 1000 mg via ORAL
  Filled 2019-01-30: qty 2

## 2019-01-30 NOTE — ED Notes (Signed)
Pt requesting covid testing. ED PA explaining to pt that testing does not change course of treatment.

## 2019-01-30 NOTE — ED Notes (Signed)
Pt ambulated without difficulty, gait steady and even. Oxygen saturations remained at 100%.

## 2019-01-30 NOTE — Discharge Instructions (Signed)
You can access your COVID-19 test results on MyChart. They should result in 1-2 days. You should quarantine in your home, away from friends and other family members, until you have been feeling better for a total of 3 days. Continue use of Tylenol for headache. You may use Delsym for cough. Use 2 puffs of an albuterol inhaler every 6 hours for cough, wheezing, or shortness of breath. You may take Naproxen for headache as needed. Follow up with a primary care doctor to ensure that symptoms resolve. If you experience worsening symptoms, such as inability to breath, increased vomiting, passing out, return to the ED for evaluation.  ------  Puede acceder a los resultados de sus pruebas COVID-19 en MyChart. Deben dar lugar a 1-2 das. Usted debe poner en cuarentena en su casa, lejos de amigos y otros miembros de la familia, Teacher, adult education que se haya sentido mejor durante un total de 3 [address]  [address]. Continuar el uso de Tylenol para el dolor de [place]. Puede usar Delsym para toser. Use 2 bocanadas de un inhalador de albuterol cada 6 horas para toser, sibilancias o dificultad para respirar. Usted puede tomar Naproxen para el dolor de cabeza segn sea necesario. Haga un seguimiento con un mdico de atencin primaria para asegurarse de que los sntomas se resuelvan. Si experimentas un empeoramiento de los sntomas, como incapacidad para respirar, aumento de los vmitos, [place], regresa al ED para que te evale.

## 2019-01-30 NOTE — ED Provider Notes (Signed)
[facility] EMERGENCY DEPARTMENT Provider Note   CSN: [id number] Arrival date & time: 01/30/19  2146    History   Chief Complaint Chief Complaint  Patient presents with  . Fever  . Cough  . Headache    HPI Latressa Harries is a 48 y.o. female.    48 year old female with a history of hypertension presents to the ED for complaints of persistent URI symptoms.  Notes body aches with subjective fever characterized by chills and "feeling hot".  Fever has been improved with Tylenol.  Symptoms have been ongoing x1 week.  Also noting associated nasal congestion, nonproductive cough, persistent global, waxing and waning headache.  She states that she has not feeling better since her prior visit and is concerned that she may have COVID-19 as her husband recently tested positive for this.  No use of any other over-the-counter medications.  Denies any additional sick contacts.  The history is provided by the patient. A language interpreter was used Multimedia programmer).  Fever  Associated symptoms: cough and headaches   Cough  Associated symptoms: fever and headaches   Headache  Associated symptoms: cough and fever     Past Medical History:  Diagnosis Date  . Hypertension   . Renal disorder    hx of kidney stones    Patient Active Problem List   Diagnosis Date Noted  . Anemia 04/08/2017  . Diabetes mellitus screening 03/21/2017  . Chronic lower back pain 05/23/2016  . Adnexal cyst 03/19/2016  . Hypertensive urgency 12/07/2015  . Healthcare maintenance 12/07/2015  . Essential hypertension 12/07/2015    Past Surgical History:  Procedure Laterality Date  . CESAREAN SECTION    . LUNG SURGERY       OB History   No obstetric history on file.      Home Medications    Prior to Admission medications   Medication Sig Start Date End Date Taking? Authorizing Provider  amLODipine (NORVASC) 10 MG tablet TAKE 1 TABLET BY MOUTH ONCE DAILY 10/02/18    Marcine Matar, MD  cyclobenzaprine (FLEXERIL) 5 MG tablet TAKE 1 TABLET BY MOUTH ONCE DAILY AS NEEDED FOR MUSCLE SPASM 02/25/18   Marcine Matar, MD  hydrochlorothiazide (HYDRODIURIL) 25 MG tablet Take 1 tablet (25 mg total) by mouth daily. 09/29/18   Hoy Register, MD  naproxen (NAPROSYN) 500 MG tablet Take 1 tablet (500 mg total) by mouth every 12 (twelve) hours as needed for headache. 01/30/19   Antony Madura, PA-C  traMADol (ULTRAM) 50 MG tablet Take 1 tablet (50 mg total) by mouth every 8 (eight) hours as needed. Patient not taking: Reported on 10/30/2017 03/19/16   Pete Glatter, MD  Vitamin D, Ergocalciferol, (DRISDOL) 50000 units CAPS capsule Take 1 capsule (50,000 Units total) by mouth every 7 (seven) days. 02/04/18   Hoy Register, MD    Family History No family history on file.  Social History Social History   Tobacco Use  . Smoking status: Never Smoker  . Smokeless tobacco: Never Used  Substance Use Topics  . Alcohol use: No  . Drug use: No     Allergies   Patient has no known allergies.   Review of Systems Review of Systems  Constitutional: Positive for fever.  Respiratory: Positive for cough.   Neurological: Positive for headaches.  Ten systems reviewed and are negative for acute change, except as noted in the HPI.    Physical Exam Updated Vital Signs BP 140/90   Pulse 87  Temp 99.5 F (37.5 C) (Oral)   Resp 18   LMP 01/02/2019   SpO2 100%   Physical Exam Vitals signs and nursing note reviewed.  Constitutional:      General: She is not in acute distress.    Appearance: She is well-developed. She is not diaphoretic.     Comments: Nontoxic appearing and in NAD  HENT:     Head: Normocephalic and atraumatic.  Eyes:     General: No scleral icterus.    Conjunctiva/sclera: Conjunctivae normal.  Neck:     Musculoskeletal: Normal range of motion.  Cardiovascular:     Rate and Rhythm: Normal rate and regular rhythm.     Pulses: Normal pulses.   Pulmonary:     Effort: Pulmonary effort is normal. No respiratory distress.     Comments: Respirations even and unlabored. SpO2 100% on room air. Speaking in full sentences without dyspnea. Musculoskeletal: Normal range of motion.  Skin:    General: Skin is warm and dry.     Coloration: Skin is not pale.     Findings: No erythema or rash.  Neurological:     Mental Status: She is alert and oriented to person, place, and time.     Coordination: Coordination normal.     Comments: GCS 15. Moving all extremities spontaneously.  Psychiatric:        Behavior: Behavior normal.      ED Treatments / Results  Labs (all labs ordered are listed, but only abnormal results are displayed) Labs Reviewed  CBC WITH DIFFERENTIAL/PLATELET - Abnormal; Notable for the following components:      Result Value   RBC 5.28 (*)    Hemoglobin 11.6 (*)    MCV 72.0 (*)    MCH 22.0 (*)    All other components within normal limits  COMPREHENSIVE METABOLIC PANEL - Abnormal; Notable for the following components:   Glucose, Bld 125 (*)    All other components within normal limits  NOVEL CORONAVIRUS, NAA (HOSPITAL ORDER, SEND-OUT TO REF LAB)    EKG None  Radiology Dg Chest Portable 1 View  Result Date: 01/30/2019 CLINICAL DATA:  Covert positive.  Cough, shortness of breath EXAM: PORTABLE CHEST 1 VIEW COMPARISON:  01/21/2019 FINDINGS: Heart is normal size. No confluent airspace opacities or effusions. No acute bony abnormality. IMPRESSION: No active disease. Electronically Signed   By: Charlett Nose M.D.   On: 01/30/2019 23:15    Procedures Procedures (including critical care time)  Medications Ordered in ED Medications  albuterol (VENTOLIN HFA) 108 (90 Base) MCG/ACT inhaler 2 puff (2 puffs Inhalation Given 01/30/19 2311)  ibuprofen (ADVIL) tablet 800 mg (800 mg Oral Given 01/30/19 2310)  acetaminophen (TYLENOL) tablet 1,000 mg (1,000 mg Oral Given 01/30/19 2310)    11:34 PM Ambulatory in the ED without  hypoxia. CXR clear.   Initial Impression / Assessment and Plan / ED Course  I have reviewed the triage vital signs and the nursing notes.  Pertinent labs & imaging results that were available during my care of the patient were reviewed by me and considered in my medical decision making (see chart for details).        Pt CXR negative for acute infiltrate. Patient's symptoms are consistent with URI, likely viral etiology. Discussed that antibiotics are not indicated for viral infections. Also discussed high likelihood of COVID-19 infection as husband recently tested positive. She is afebrile in the ED without leukocytosis; can ambulate in the department on RA without hypoxia. No signs  of respiratory distress. Outpatient COVID screen is pending. Patient will be discharged with symptomatic treatment.  She verbalizes understanding and is agreeable with plan. Patient is hemodynamically stable & in NAD prior to discharge.  Abel Prestolizabeth Vasquez De Dilone was evaluated in Emergency Department on 01/30/2019 for the symptoms described in the history of present illness. She was evaluated in the context of the global COVID-19 pandemic, which necessitated consideration that the patient might be at risk for infection with the SARS-CoV-2 virus that causes COVID-19. Institutional protocols and algorithms that pertain to the evaluation of patients at risk for COVID-19 are in a state of rapid change based on information released by regulatory bodies including the CDC and federal and state organizations. These policies and algorithms were followed during the patient's care in the ED.  Vitals:   01/30/19 2152 01/30/19 2245 01/30/19 2300 01/30/19 2330  BP: (!) 148/101   140/90  Pulse: 94 89 87 87  Resp: 18     Temp: 99.5 F (37.5 C)     TempSrc: Oral     SpO2: 100% 100% 100% 100%    Final Clinical Impressions(s) / ED Diagnoses   Final diagnoses:  Exposure to Covid-19 Virus  Upper respiratory tract infection,  unspecified type    ED Discharge Orders         Ordered    naproxen (NAPROSYN) 500 MG tablet  Every 12 hours PRN     01/30/19 2340           Antony MaduraHumes, Sheanna Dail, PA-C 01/30/19 2354    Melene PlanFloyd, Dan, DO 02/02/19 [phone]

## 2019-01-30 NOTE — ED Triage Notes (Signed)
Patient here with body aches and flu like symptoms.  Reports that husband has been diagnosed with Covid 19.  Patient with fever and headache and not feeling better.

## 2019-01-30 NOTE — ED Notes (Signed)
Pt remains on her cell phone. Will not get off to talk to this RN.

## 2019-01-31 MED FILL — NAPROXEN 500 MG TABLET: 500 | 7 days supply | Qty: 15 | Fill #0

## 2019-02-01 LAB — NOVEL CORONAVIRUS, NAA (HOSP ORDER, SEND-OUT TO REF LAB; TAT 18-24 HRS): SARS-CoV-2, NAA: NOT DETECTED

## 2019-03-07 ENCOUNTER — Other Ambulatory Visit: Payer: Self-pay | Admitting: Internal Medicine

## 2019-03-07 DIAGNOSIS — I10 Essential (primary) hypertension: Secondary | ICD-10-CM

## 2019-07-15 ENCOUNTER — Other Ambulatory Visit: Payer: Self-pay | Admitting: Family Medicine

## 2019-07-16 NOTE — Telephone Encounter (Signed)
Medical Assistant left message on patient's home and cell voicemail. Voicemail states to give a call back to [place] with [facility] at [phone]. !!Patient needs an appointment to recheck vitamin d levels!!

## 2020-04-27 IMAGING — DX PORTABLE CHEST - 1 VIEW
1 series · 1 of 1 positions shown · non-contrast
Comparison: 01/21/2019

CLINICAL DATA: Covert positive.  Cough, shortness of breath

EXAM:
PORTABLE CHEST 1 VIEW

[chest ap]
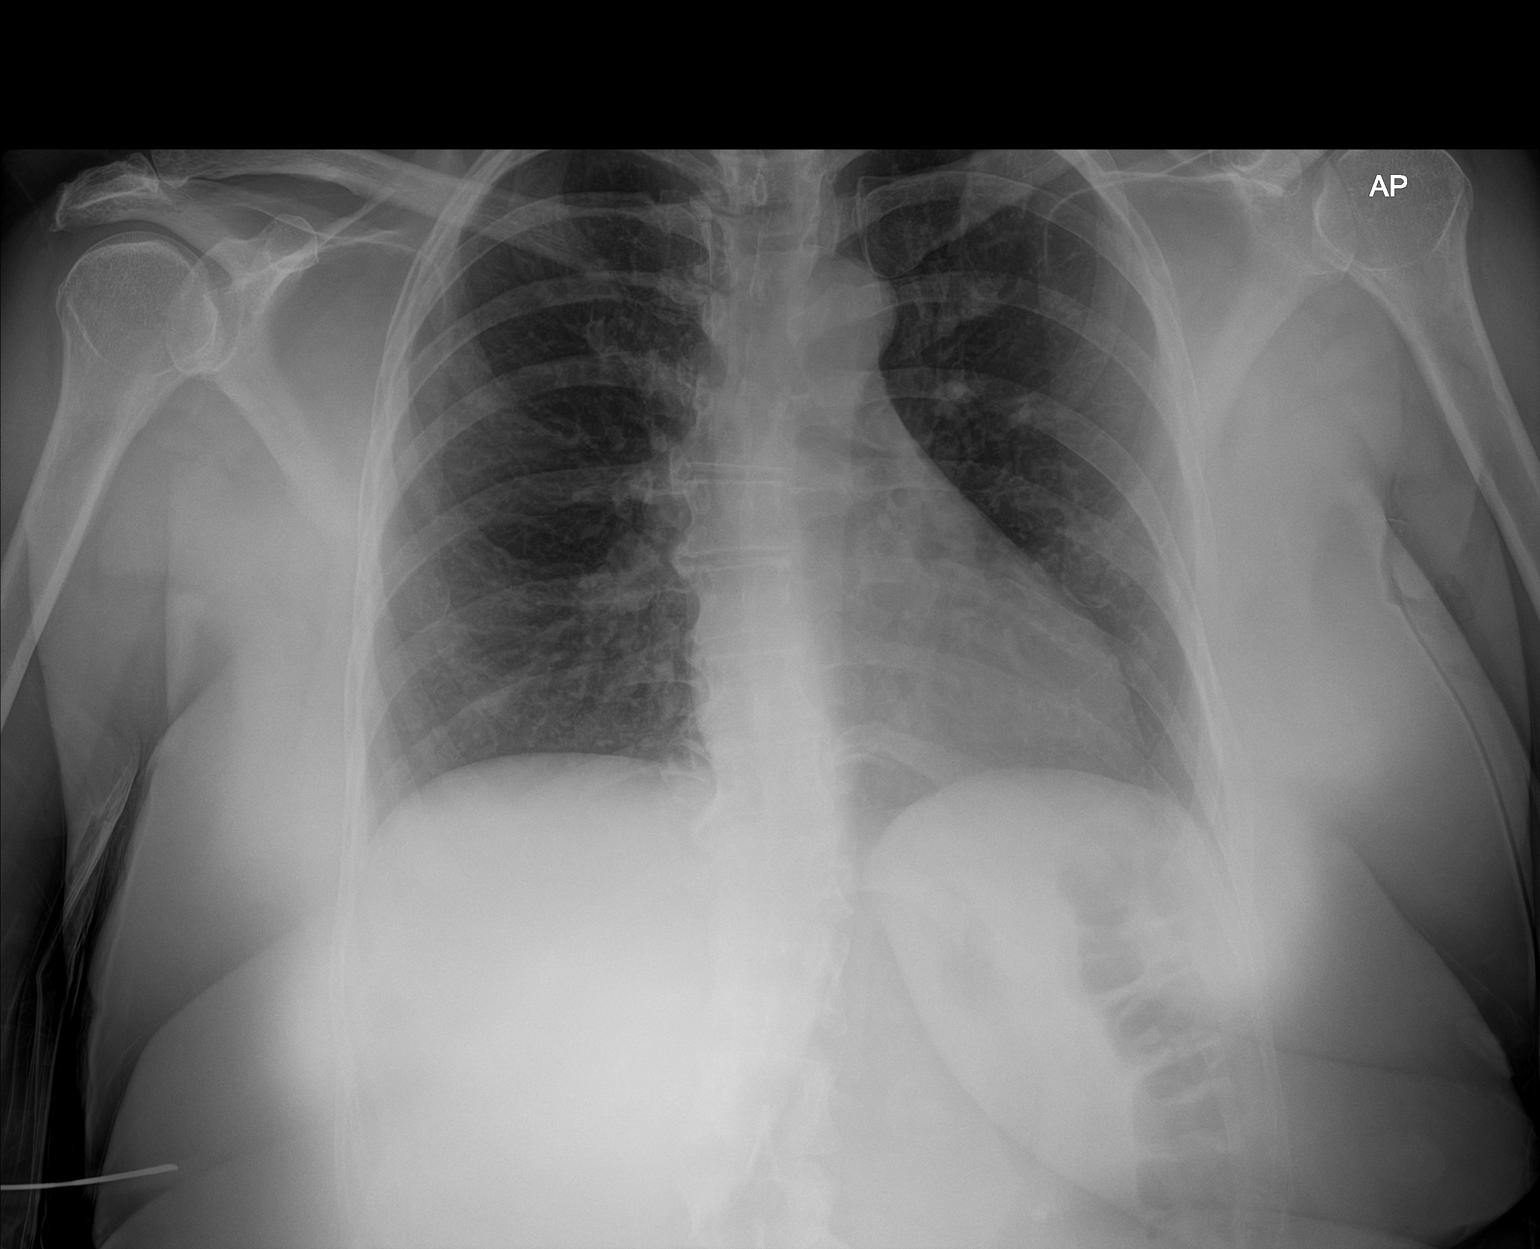

[1 of 1 positions shown; findings below may reference images not displayed]

FINDINGS: Heart is normal size. No confluent airspace opacities or effusions.
No acute bony abnormality.
IMPRESSION: No active disease.

## 2020-07-18 ENCOUNTER — Encounter: Payer: Self-pay | Admitting: Family Medicine

## 2020-07-18 ENCOUNTER — Ambulatory Visit (INDEPENDENT_AMBULATORY_CARE_PROVIDER_SITE_OTHER): Payer: BLUE CROSS/BLUE SHIELD | Admitting: Family Medicine

## 2020-07-18 ENCOUNTER — Other Ambulatory Visit: Payer: Self-pay

## 2020-07-18 ENCOUNTER — Other Ambulatory Visit: Payer: Self-pay | Admitting: Family Medicine

## 2020-07-18 DIAGNOSIS — E559 Vitamin D deficiency, unspecified: Secondary | ICD-10-CM

## 2020-07-18 DIAGNOSIS — M546 Pain in thoracic spine: Secondary | ICD-10-CM | POA: Diagnosis not present

## 2020-07-18 DIAGNOSIS — M25571 Pain in right ankle and joints of right foot: Secondary | ICD-10-CM | POA: Diagnosis not present

## 2020-07-18 DIAGNOSIS — G8929 Other chronic pain: Secondary | ICD-10-CM

## 2020-07-18 DIAGNOSIS — M25572 Pain in left ankle and joints of left foot: Secondary | ICD-10-CM | POA: Diagnosis not present

## 2020-07-18 DIAGNOSIS — M545 Low back pain, unspecified: Secondary | ICD-10-CM

## 2020-07-18 MED ORDER — VITAMIN D-3 125 MCG (5000 UT) PO TABS: 1.0000 | ORAL_TABLET | Freq: Every day | ORAL | 3 refills | Status: AC

## 2020-07-18 MED ORDER — VITAMIN K2 100 MCG PO TABS: 100.0000 ug | ORAL_TABLET | Freq: Every day | ORAL | 3 refills | Status: AC

## 2020-07-18 MED ORDER — NABUMETONE 750 MG PO TABS: 750.0000 mg | ORAL_TABLET | Freq: Two times a day (BID) | ORAL | 6 refills | Status: DC | PRN

## 2020-07-18 NOTE — Progress Notes (Signed)
Office Visit Note   Patient: Debbie Stone           Date of Birth: 1971-09-06           MRN: [id number] Visit Date: 07/18/2020 Requested by: No referring provider defined for this encounter. PCP: Patient, No Pcp Per  Subjective: Chief Complaint  Patient presents with  . Right Foot - Pain    Has known plantar fasciitis bilaterally. Has been seeing a podiatrist (care everywhere). Got cortisone injections in each foot - lasted 1 week. She tried the expensive custom orthotics - made burning pain worse - orthotics are hard.  . Left Foot - Pain    HPI: She is here with back pain and bilateral heel pain.  Interpreter was present today.  Her back has bothered her for quite a while.  She attributes it to her work which involves a lot of standing, twisting, and lifting.  Pain is mostly in the midline and involves the upper and lower back.  Her heels have bothered her for roughly a year.  No injury, pain on the plantar aspect.  She went to a podiatrist and had injections which gave her relief for about a week.  Then she was given custom orthotics which made her pain worse.  She has not taken any medications for her pain.  She has a history of vitamin D deficiency 2 years ago but is not on any treatment for that.                ROS:   All other systems were reviewed and are negative.  Objective: Vital Signs: There were no vitals taken for this visit.  Physical Exam:  General:  Alert and oriented, in no acute distress. Pulm:  Breathing unlabored. Psy:  Normal mood, congruent affect.  Back: She has diffuse muscular tenderness in the paraspinous muscles from the thoracic spine down to the lumbar spine. Feet: She has bilateral tight hamstrings and heel cords.  Both feet are tender at the medial origin of the plantar fascia.  No pain with medial/lateral calcaneal squeeze bilaterally.   Imaging: No results found.  Assessment & Plan: 1.  Chronic bilateral heel pain  consistent with plantar fasciitis.  Inflexibility predisposing her to this problem. -Physical therapy, home stretches.  Relafen as needed. -Resume taking vitamin D3 and vitamin K2 for bone strength.  2.  Chronic myofascial back pain -Physical therapy, Relafen as needed.  3.  She also reported at the end of the visit that her left knee has been hurting for about 10 years.  She wanted x-rays but we did not have time today.  We can evaluate this at another visit.     Procedures: No procedures performed  No notes on file     PMFS History: Patient Active Problem List   Diagnosis Date Noted  . Anemia 04/08/2017  . Diabetes mellitus screening 03/21/2017  . Chronic lower back pain 05/23/2016  . Adnexal cyst 03/19/2016  . Hypertensive urgency 12/07/2015  . Healthcare maintenance 12/07/2015  . Essential hypertension 12/07/2015   Past Medical History:  Diagnosis Date  . Hypertension   . Renal disorder    hx of kidney stones    History reviewed. No pertinent family history.  Past Surgical History:  Procedure Laterality Date  . CESAREAN SECTION    . LUNG SURGERY     Social History   Occupational History  . Not on file  Tobacco Use  . Smoking status: Never Smoker  .  Smokeless tobacco: Never Used  Substance and Sexual Activity  . Alcohol use: No  . Drug use: No  . Sexual activity: Yes

## 2020-07-19 MED FILL — NABUMETONE 750 MG TABS: 750 | 30 days supply | Qty: 60 | Fill #0

## 2020-07-22 ENCOUNTER — Telehealth: Payer: Self-pay | Admitting: Family Medicine

## 2020-07-22 NOTE — Telephone Encounter (Signed)
Can this info somehow be added to the referral for physical therapy?

## 2020-07-22 NOTE — Telephone Encounter (Signed)
Patient's daughter asked that when the referral for PT is assigned can they please call with interpretor in spanish.

## 2020-08-28 ENCOUNTER — Emergency Department (HOSPITAL_COMMUNITY): Payer: BLUE CROSS/BLUE SHIELD

## 2020-08-28 ENCOUNTER — Other Ambulatory Visit: Payer: Self-pay

## 2020-08-28 ENCOUNTER — Encounter (HOSPITAL_COMMUNITY): Payer: Self-pay

## 2020-08-28 ENCOUNTER — Emergency Department (HOSPITAL_COMMUNITY)
Admission: EM | Admit: 2020-08-28 | Discharge: 2020-08-28 | Disposition: A | Discharge status: Home / Self Care | Payer: BLUE CROSS/BLUE SHIELD | Attending: Emergency Medicine | Admitting: Emergency Medicine

## 2020-08-28 DIAGNOSIS — I10 Essential (primary) hypertension: Secondary | ICD-10-CM | POA: Insufficient documentation

## 2020-08-28 DIAGNOSIS — M25562 Pain in left knee: Secondary | ICD-10-CM | POA: Diagnosis present

## 2020-08-28 DIAGNOSIS — X501XXA Overexertion from prolonged static or awkward postures, initial encounter: Secondary | ICD-10-CM | POA: Insufficient documentation

## 2020-08-28 DIAGNOSIS — Y92520 Airport as the place of occurrence of the external cause: Secondary | ICD-10-CM | POA: Diagnosis not present

## 2020-08-28 DIAGNOSIS — Y9389 Activity, other specified: Secondary | ICD-10-CM | POA: Insufficient documentation

## 2020-08-28 DIAGNOSIS — M25561 Pain in right knee: Secondary | ICD-10-CM

## 2020-08-28 DIAGNOSIS — Z79899 Other long term (current) drug therapy: Secondary | ICD-10-CM | POA: Diagnosis not present

## 2020-08-28 MED ORDER — ACETAMINOPHEN 325 MG PO TABS: 650.0000 mg | ORAL_TABLET | Freq: Four times a day (QID) | ORAL | 0 refills | Status: AC | PRN

## 2020-08-28 MED ORDER — IBUPROFEN 600 MG PO TABS: 600.0000 mg | ORAL_TABLET | Freq: Four times a day (QID) | ORAL | 0 refills | Status: AC | PRN

## 2020-08-28 NOTE — Discharge Instructions (Signed)
Stop taking diclofenac pills.  Instead you can take the tylenol and motrin that I prescribed for pain.    Wear your leg brace whenever walking.  You can take it off when sleeping or showering.  Use your crutches whenever walking.  You should follow up with the orthopedic doctor in 1-2 weeks in the office.  You can ask for an appointment with Dr. Roda Shutters (pronounced "Shoe").

## 2020-08-28 NOTE — ED Triage Notes (Signed)
Pt presents with c/o right knee injury. Pt slipped while going up the stairs yesterday. Family reports she twisted her right knee when she slipped, they report swelling at the area.

## 2020-08-28 NOTE — ED Provider Notes (Signed)
[facility]-EMERGENCY DEPT Provider Note   CSN: [id number] Arrival date & time: 08/28/20  1258     History Chief Complaint  Patient presents with  . Knee Pain    Debbie Stone is a 49 y.o. female who is primarily Spanish-speaking, present emergency department right knee pain.  She reports [place] calm down her nephew while getting on an escalator, spun around on the railing, performed a sudden twisting motion at the knee.  She had immediate pain in her knee and swelling.  She has been able to walk only lightly on this, but very limited mobility due to pain.  She has no prior history of injuries to this knee.  There were no other injuries reported.  She presents with her daughter who is in college, and is able to translate fluidly in [place] and [place].  HPI     Past Medical History:  Diagnosis Date  . Hypertension   . Renal disorder    hx of kidney stones    Patient Active Problem List   Diagnosis Date Noted  . Anemia 04/08/2017  . Diabetes mellitus screening 03/21/2017  . Chronic lower back pain 05/23/2016  . Adnexal cyst 03/19/2016  . Hypertensive urgency 12/07/2015  . Healthcare maintenance 12/07/2015  . Essential hypertension 12/07/2015    Past Surgical History:  Procedure Laterality Date  . CESAREAN SECTION    . LUNG SURGERY       OB History   No obstetric history on file.     History reviewed. No pertinent family history.  Social History   Tobacco Use  . Smoking status: Never Smoker  . Smokeless tobacco: Never Used  Substance Use Topics  . Alcohol use: No  . Drug use: No    Home Medications Prior to Admission medications   Medication Sig Start Date End Date Taking? Authorizing Provider  acetaminophen (TYLENOL) 325 MG tablet Take 2 tablets (650 mg total) by mouth every 6 (six) hours as needed for up to 30 doses for mild pain. 08/28/20   Terald Sleeper, MD   amLODipine (NORVASC) 10 MG tablet TAKE 1 TABLET BY MOUTH ONCE DAILY 10/02/18   Marcine Matar, MD  Cholecalciferol (VITAMIN D-3) 125 MCG (5000 UT) TABS Take 1 tablet by mouth daily. 07/18/20   Hilts, Casimiro Needle, MD  diclofenac Sodium (VOLTAREN) 1 % GEL Apply 1 application topically 4 (four) times daily. 03/31/20   [provider]  ibuprofen (ADVIL) 600 MG tablet Take 1 tablet (600 mg total) by mouth every 6 (six) hours as needed for up to 30 doses for mild pain or moderate pain. 08/28/20   Terald Sleeper, MD  Menatetrenone (VITAMIN K2) 100 MCG TABS Take 100 mcg by mouth daily. 07/18/20   Hilts, Casimiro Needle, MD  nabumetone (RELAFEN) 750 MG tablet Take 1 tablet (750 mg total) by mouth 2 (two) times daily as needed. 07/18/20   Hilts, Casimiro Needle, MD  naproxen (NAPROSYN) 500 MG tablet Take 1 tablet (500 mg total) by mouth every 12 (twelve) hours as needed for headache. 01/30/19   Antony Madura, PA-C    Allergies    Patient has no known allergies.  Review of Systems   Review of Systems  Constitutional: Negative for chills and fever.  Eyes: Negative for pain and visual disturbance.  Respiratory: Negative for cough and shortness of breath.   Cardiovascular: Negative for chest pain and palpitations.  Gastrointestinal: Negative for abdominal pain and  vomiting.  Genitourinary: Negative for dysuria and hematuria.  Musculoskeletal: Positive for arthralgias and myalgias.  Skin: Negative for color change and rash.  Neurological: Negative for numbness.  All other systems reviewed and are negative.   Physical Exam Updated Vital Signs BP (!) 142/99 (BP Location: Right Arm)   Pulse 82   Temp 98.7 F (37.1 C) (Oral)   Resp 16   LMP 08/20/2020 (Approximate)   SpO2 99%   Physical Exam Vitals and nursing note reviewed.  Constitutional:      General: She is not in acute distress.    Appearance: She is well-developed.  HENT:     Head: Normocephalic and atraumatic.  Eyes:     Conjunctiva/sclera:  Conjunctivae normal.  Cardiovascular:     Rate and Rhythm: Normal rate and regular rhythm.     Pulses: Normal pulses.  Pulmonary:     Effort: Pulmonary effort is normal. No respiratory distress.     Breath sounds: Normal breath sounds.  Musculoskeletal:     Cervical back: Neck supple.     Comments: Moderate effusion around right knee No isolated patellar ttp Ecchymosis of the distal quadriceps without visible deformity Patient able to passively flex and extend right lower leg Unable to bear weight due to pain  Skin:    General: Skin is warm and dry.  Neurological:     Mental Status: She is alert.  Psychiatric:        Mood and Affect: Mood normal.        Behavior: Behavior normal.     ED Results / Procedures / Treatments   Labs (all labs ordered are listed, but only abnormal results are displayed) Labs Reviewed - No data to display  EKG None  Radiology DG Knee Complete 4 Views Right  Result Date: 08/28/2020 CLINICAL DATA:  Fall with knee pain EXAM: RIGHT KNEE - COMPLETE 4+ VIEW COMPARISON:  None. FINDINGS: No evidence of fracture or dislocation. There is a moderate knee joint effusion. A superior patellar osteophyte is noted. IMPRESSION: 1. Moderate knee joint effusion without evidence of acute fracture or dislocation. Electronically Signed   By: Romona Curls M.D.   On: 08/28/2020 13:53    Procedures Procedures (including critical care time)  Medications Ordered in ED Medications - No data to display  ED Course  I have reviewed the triage vital signs and the nursing notes.  Pertinent labs & imaging results that were available during my care of the patient were reviewed by me and considered in my medical decision making (see chart for details).  Knee pain after lateral torsion type injury on an escalator yesterday Most likely a ligament tear - possible MCL, with this mechanism Xrays show no acute fractures She can range her lower leg, which lowers my suspicion for  a complete tendon rupture  We'll place in a knee immobilizer, prescribe motrin+ tylenol, give crutches.  I made clear to her and her daughter she needs orthopedic f/u, and should avoid prolonged standing.   Work note provided.  All questions answered.  Final Clinical Impression(s) / ED Diagnoses Final diagnoses:  Acute pain of right knee    Rx / DC Orders ED Discharge Orders         Ordered    ibuprofen (ADVIL) 600 MG tablet  Every 6 hours PRN        08/28/20 1504    acetaminophen (TYLENOL) 325 MG tablet  Every 6 hours PRN        08/28/20 1504  Terald Sleeper, MD 08/29/20 1235

## 2020-08-28 NOTE — Progress Notes (Signed)
Orthopedic Tech Progress Note Patient Details:  Debbie Stone 1971/04/28 [id number]  Ortho Devices Type of Ortho Device: Crutches, Knee Immobilizer Ortho Device/Splint Location: right Ortho Device/Splint Interventions: Application   Post Interventions Patient Tolerated: Well, Ambulated well Instructions Provided: Poper ambulation with device, Care of device, Adjustment of device   Saul Fordyce 08/28/2020, 3:37 PM

## 2020-09-09 ENCOUNTER — Ambulatory Visit: Payer: BLUE CROSS/BLUE SHIELD | Admitting: Orthopaedic Surgery

## 2020-09-13 ENCOUNTER — Ambulatory Visit: Payer: BLUE CROSS/BLUE SHIELD | Admitting: Orthopaedic Surgery

## 2021-11-24 IMAGING — CR DG KNEE COMPLETE 4+V*R*
4 series · 4 of 4 positions shown · non-contrast
Comparison: None.

CLINICAL DATA: Fall with knee pain

EXAM:
RIGHT KNEE - COMPLETE 4+ VIEW

[t knee ap right]
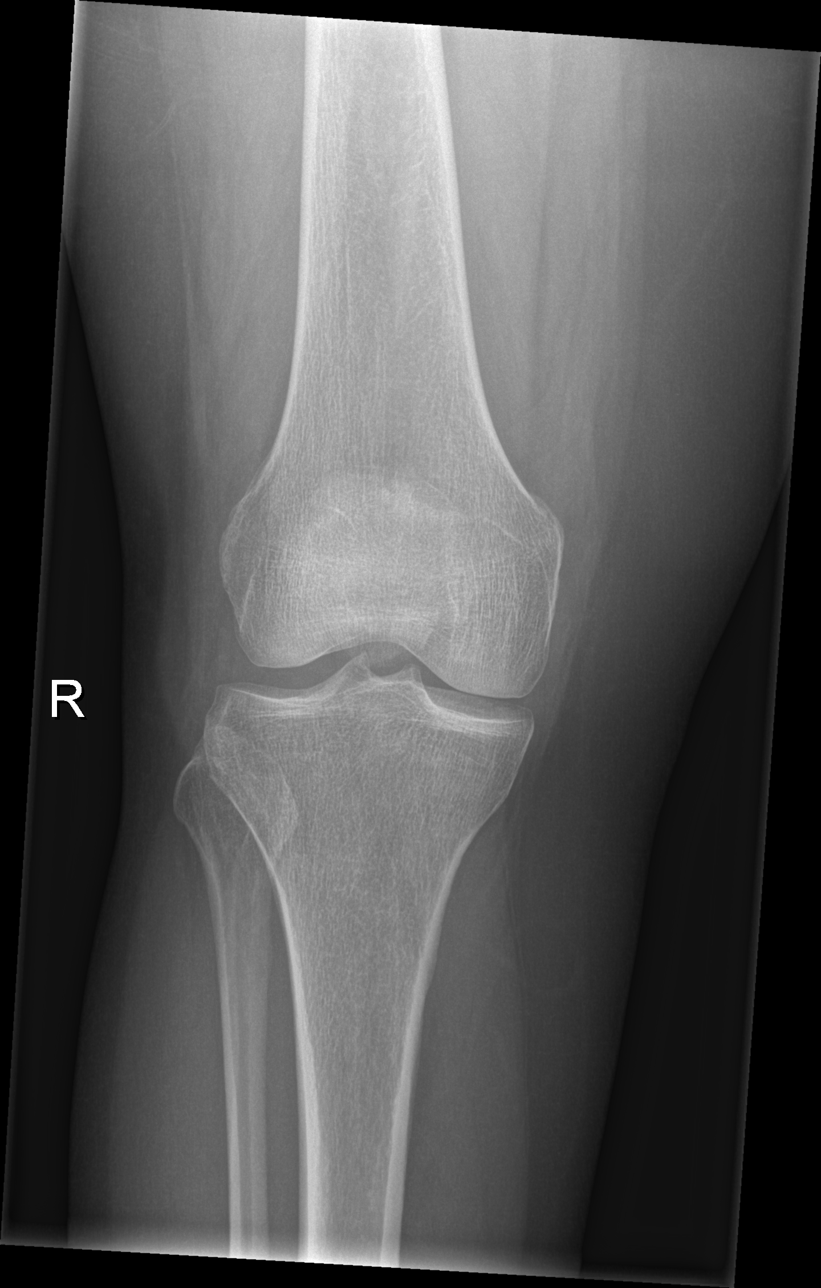

[t knee obl right (1 of 2)]
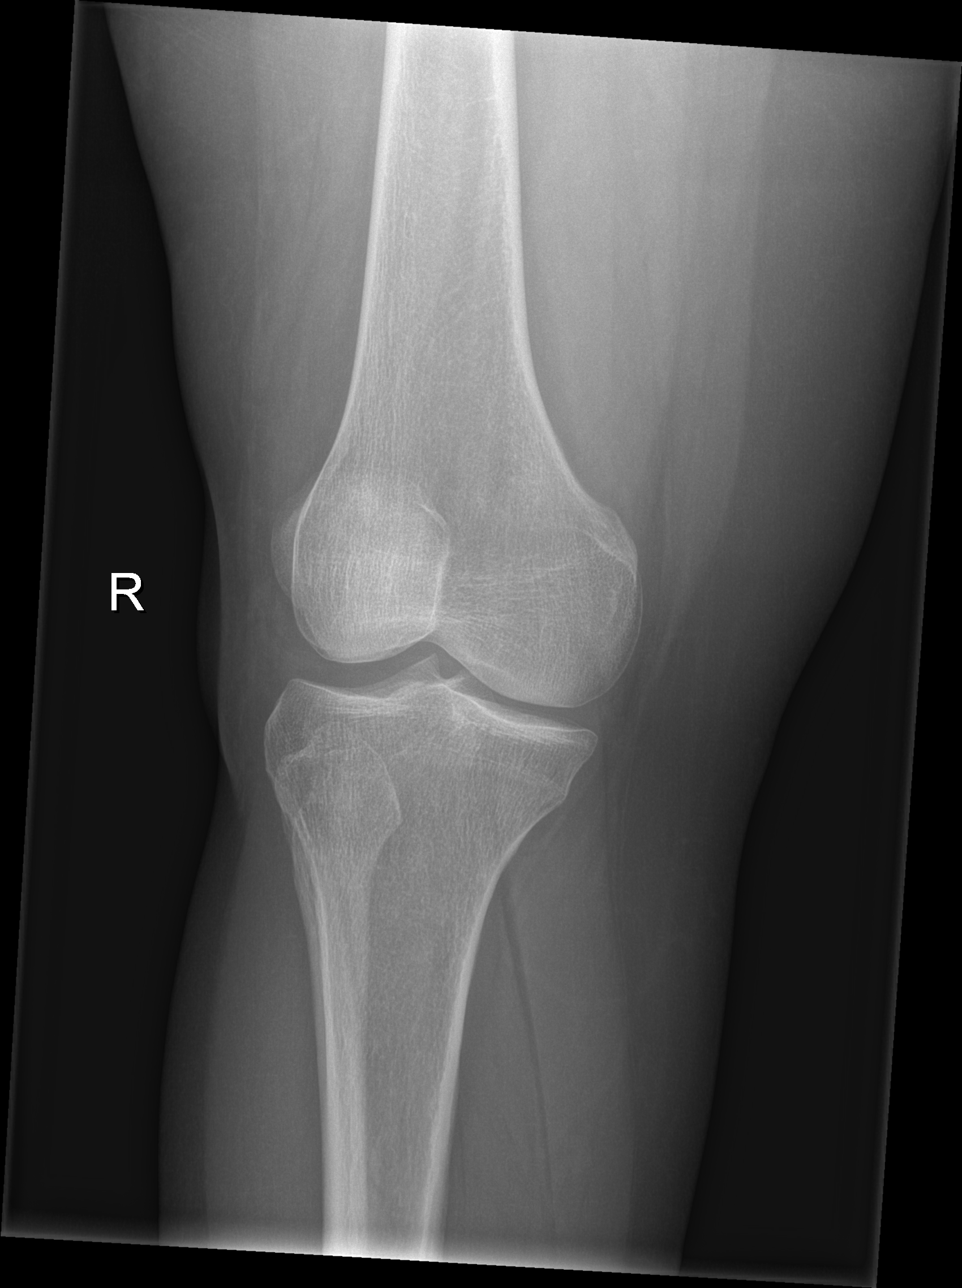

[t knee obl right (2 of 2)]
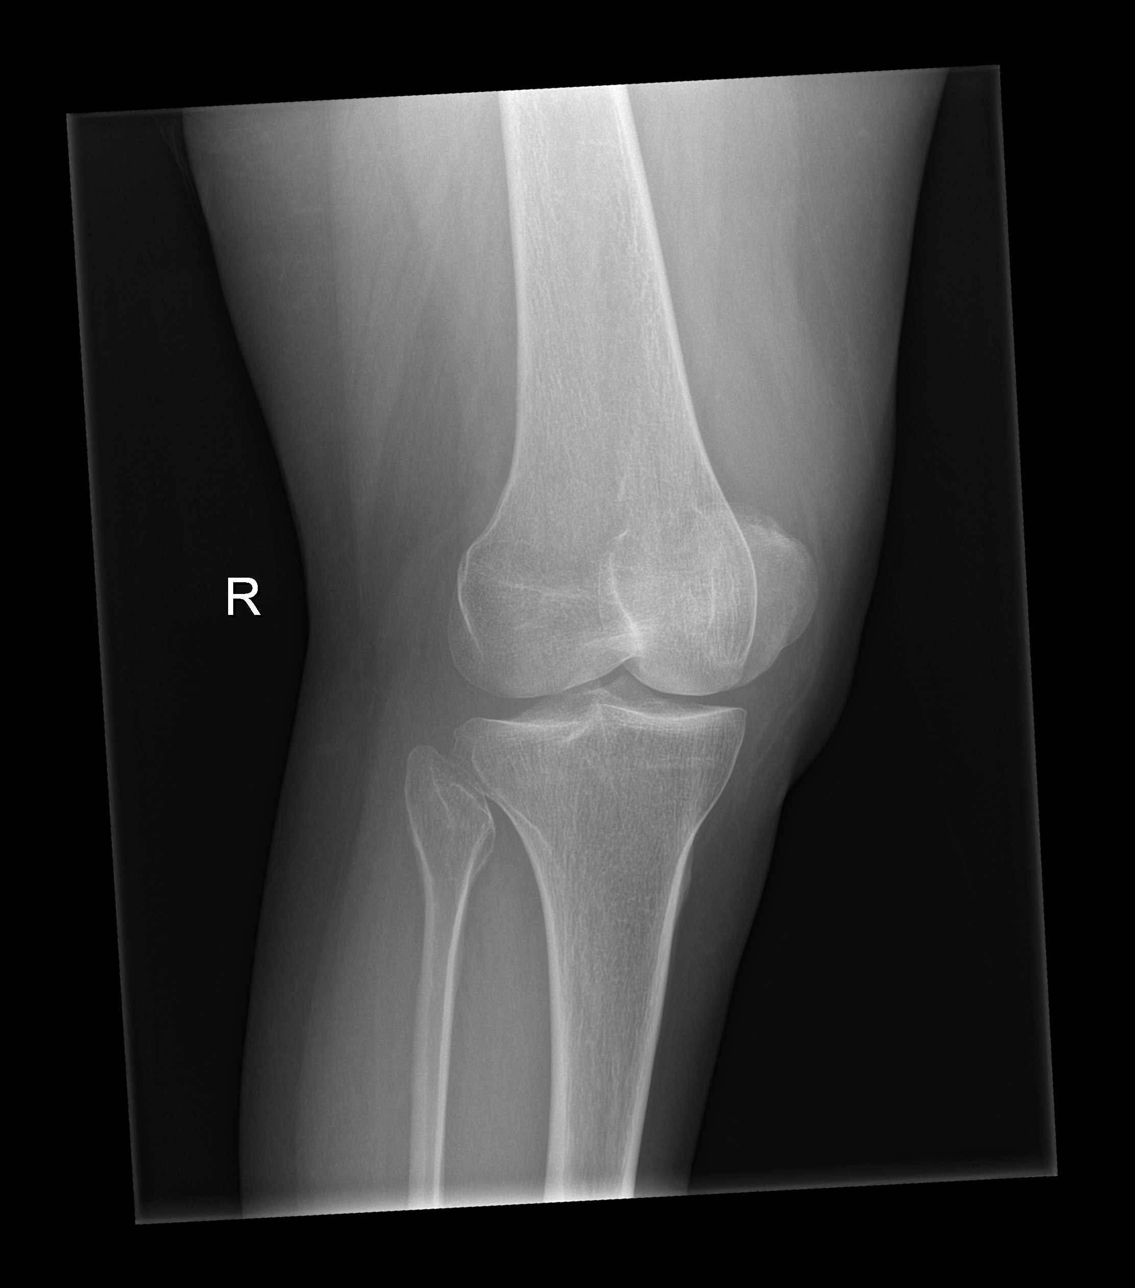

[x knee lat right]
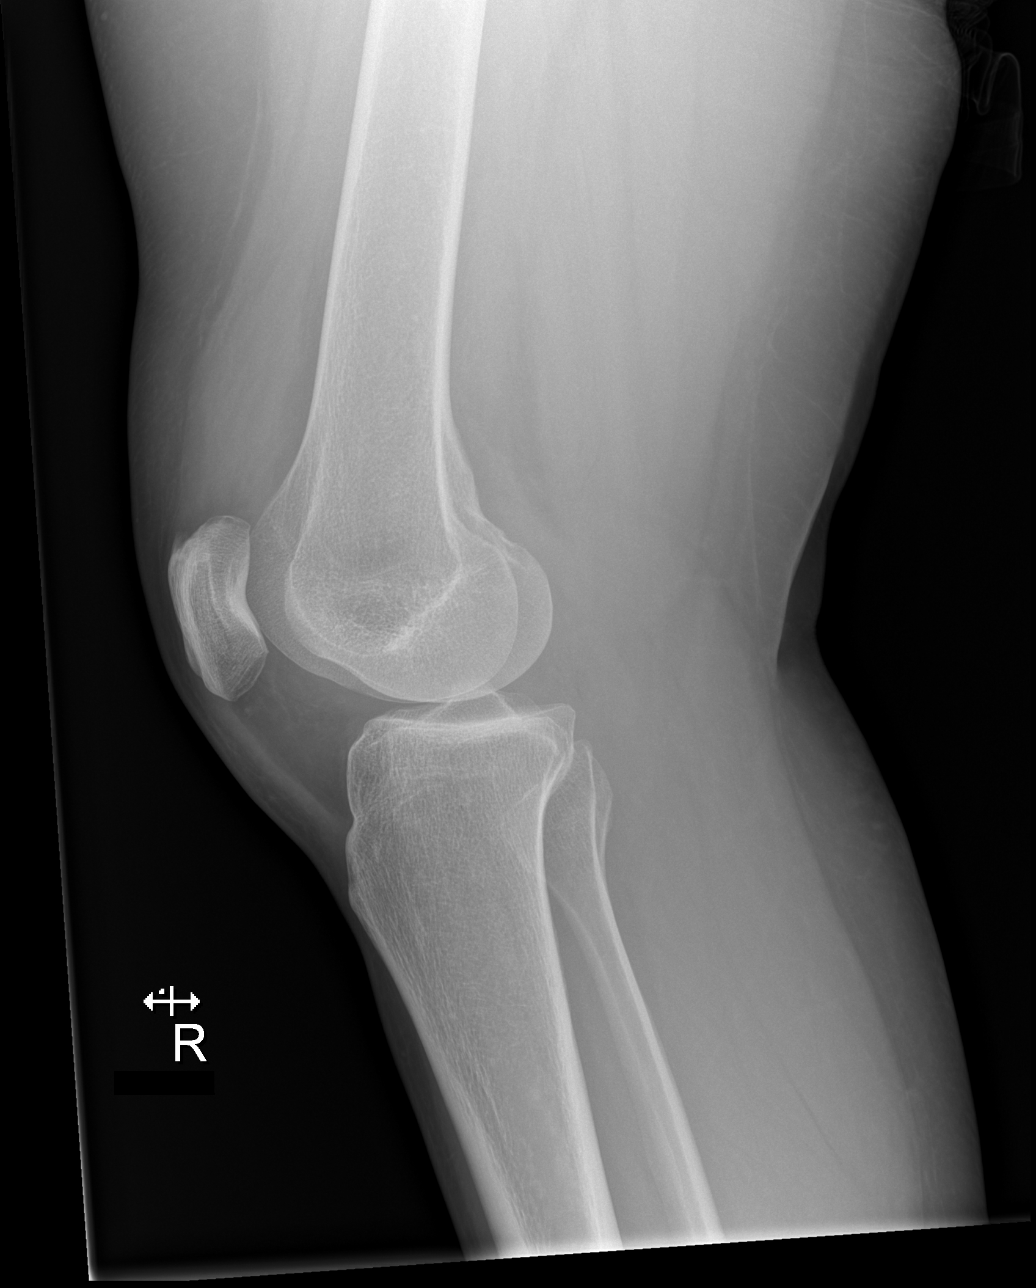

[4 of 4 positions shown; findings below may reference images not displayed]

FINDINGS: No evidence of fracture or dislocation. There is a moderate knee
joint effusion. A superior patellar osteophyte is noted.
IMPRESSION: 1. Moderate knee joint effusion without evidence of acute fracture
or dislocation.

## 2022-09-04 ENCOUNTER — Other Ambulatory Visit: Payer: Self-pay | Admitting: Nurse Practitioner

## 2022-09-04 ENCOUNTER — Other Ambulatory Visit (HOSPITAL_COMMUNITY)
Admission: RE | Admit: 2022-09-04 | Discharge: 2022-09-04 | Disposition: A | Discharge status: Home / Self Care | Payer: Commercial Managed Care - HMO | Source: Ambulatory Visit | Attending: Nurse Practitioner | Admitting: Nurse Practitioner

## 2022-09-04 DIAGNOSIS — Z124 Encounter for screening for malignant neoplasm of cervix: Secondary | ICD-10-CM | POA: Insufficient documentation

## 2022-09-11 LAB — CYTOLOGY - PAP
Comment: NEGATIVE
Diagnosis: NEGATIVE
Diagnosis: REACTIVE
High risk HPV: NEGATIVE

## 2022-11-01 DIAGNOSIS — Z1231 Encounter for screening mammogram for malignant neoplasm of breast: Secondary | ICD-10-CM | POA: Diagnosis not present

## 2022-11-01 DIAGNOSIS — E559 Vitamin D deficiency, unspecified: Secondary | ICD-10-CM | POA: Diagnosis not present

## 2022-11-01 DIAGNOSIS — I10 Essential (primary) hypertension: Secondary | ICD-10-CM | POA: Diagnosis not present

## 2022-11-01 DIAGNOSIS — D509 Iron deficiency anemia, unspecified: Secondary | ICD-10-CM | POA: Diagnosis not present

## 2022-11-01 DIAGNOSIS — Z6832 Body mass index (BMI) 32.0-32.9, adult: Secondary | ICD-10-CM | POA: Diagnosis not present

## 2022-12-31 DIAGNOSIS — L658 Other specified nonscarring hair loss: Secondary | ICD-10-CM | POA: Diagnosis not present

## 2023-02-13 DIAGNOSIS — I10 Essential (primary) hypertension: Secondary | ICD-10-CM | POA: Diagnosis not present

## 2023-02-13 DIAGNOSIS — Z6832 Body mass index (BMI) 32.0-32.9, adult: Secondary | ICD-10-CM | POA: Diagnosis not present

## 2023-02-13 DIAGNOSIS — R52 Pain, unspecified: Secondary | ICD-10-CM | POA: Diagnosis not present

## 2023-03-19 DIAGNOSIS — Z6831 Body mass index (BMI) 31.0-31.9, adult: Secondary | ICD-10-CM | POA: Diagnosis not present

## 2023-03-19 DIAGNOSIS — G4700 Insomnia, unspecified: Secondary | ICD-10-CM | POA: Diagnosis not present

## 2023-03-19 DIAGNOSIS — I10 Essential (primary) hypertension: Secondary | ICD-10-CM | POA: Diagnosis not present

## 2023-03-19 DIAGNOSIS — R519 Headache, unspecified: Secondary | ICD-10-CM | POA: Diagnosis not present

## 2023-04-02 DIAGNOSIS — L658 Other specified nonscarring hair loss: Secondary | ICD-10-CM | POA: Diagnosis not present

## 2023-04-03 DIAGNOSIS — R109 Unspecified abdominal pain: Secondary | ICD-10-CM | POA: Diagnosis not present

## 2023-04-03 DIAGNOSIS — Z6832 Body mass index (BMI) 32.0-32.9, adult: Secondary | ICD-10-CM | POA: Diagnosis not present

## 2023-04-03 DIAGNOSIS — R1012 Left upper quadrant pain: Secondary | ICD-10-CM | POA: Diagnosis not present

## 2023-04-03 DIAGNOSIS — I10 Essential (primary) hypertension: Secondary | ICD-10-CM | POA: Diagnosis not present

## 2023-04-26 DIAGNOSIS — R109 Unspecified abdominal pain: Secondary | ICD-10-CM | POA: Diagnosis not present

## 2023-05-01 DIAGNOSIS — Z6831 Body mass index (BMI) 31.0-31.9, adult: Secondary | ICD-10-CM | POA: Diagnosis not present

## 2023-05-01 DIAGNOSIS — R1012 Left upper quadrant pain: Secondary | ICD-10-CM | POA: Diagnosis not present

## 2023-07-18 DIAGNOSIS — Z131 Encounter for screening for diabetes mellitus: Secondary | ICD-10-CM | POA: Diagnosis not present

## 2023-07-18 DIAGNOSIS — Z6832 Body mass index (BMI) 32.0-32.9, adult: Secondary | ICD-10-CM | POA: Diagnosis not present

## 2023-07-18 DIAGNOSIS — R1012 Left upper quadrant pain: Secondary | ICD-10-CM | POA: Diagnosis not present

## 2023-07-18 DIAGNOSIS — Z0000 Encounter for general adult medical examination without abnormal findings: Secondary | ICD-10-CM | POA: Diagnosis not present

## 2023-07-18 DIAGNOSIS — I10 Essential (primary) hypertension: Secondary | ICD-10-CM | POA: Diagnosis not present

## 2023-07-18 DIAGNOSIS — Z13220 Encounter for screening for lipoid disorders: Secondary | ICD-10-CM | POA: Diagnosis not present

## 2023-07-19 ENCOUNTER — Other Ambulatory Visit: Payer: Self-pay | Admitting: Family Medicine

## 2023-07-19 DIAGNOSIS — R1012 Left upper quadrant pain: Secondary | ICD-10-CM

## 2023-07-23 DIAGNOSIS — L658 Other specified nonscarring hair loss: Secondary | ICD-10-CM | POA: Diagnosis not present

## 2023-07-24 ENCOUNTER — Inpatient Hospital Stay: Admission: RE | Admit: 2023-07-24 | Payer: Commercial Managed Care - HMO | Source: Ambulatory Visit

## 2023-07-31 ENCOUNTER — Ambulatory Visit
Admission: RE | Admit: 2023-07-31 | Discharge: 2023-07-31 | Disposition: A | Discharge status: Home / Self Care | Payer: 59 | Source: Ambulatory Visit | Attending: Family Medicine | Admitting: Family Medicine

## 2023-07-31 DIAGNOSIS — R1012 Left upper quadrant pain: Secondary | ICD-10-CM

## 2023-07-31 DIAGNOSIS — N281 Cyst of kidney, acquired: Secondary | ICD-10-CM | POA: Diagnosis not present

## 2023-08-09 DIAGNOSIS — Z6832 Body mass index (BMI) 32.0-32.9, adult: Secondary | ICD-10-CM | POA: Diagnosis not present

## 2023-08-09 DIAGNOSIS — M549 Dorsalgia, unspecified: Secondary | ICD-10-CM | POA: Diagnosis not present

## 2023-08-15 DIAGNOSIS — M419 Scoliosis, unspecified: Secondary | ICD-10-CM | POA: Diagnosis not present

## 2023-08-15 DIAGNOSIS — G8929 Other chronic pain: Secondary | ICD-10-CM | POA: Diagnosis not present

## 2023-08-15 DIAGNOSIS — M545 Low back pain, unspecified: Secondary | ICD-10-CM | POA: Diagnosis not present

## 2023-08-15 DIAGNOSIS — Z6832 Body mass index (BMI) 32.0-32.9, adult: Secondary | ICD-10-CM | POA: Diagnosis not present

## 2023-09-18 DIAGNOSIS — R1032 Left lower quadrant pain: Secondary | ICD-10-CM | POA: Diagnosis not present

## 2023-09-18 DIAGNOSIS — G8929 Other chronic pain: Secondary | ICD-10-CM | POA: Diagnosis not present

## 2023-09-18 DIAGNOSIS — Z6832 Body mass index (BMI) 32.0-32.9, adult: Secondary | ICD-10-CM | POA: Diagnosis not present

## 2023-09-18 DIAGNOSIS — I10 Essential (primary) hypertension: Secondary | ICD-10-CM | POA: Diagnosis not present
